# Patient Record
Sex: Male | Born: 1955
Health system: Southern US, Community
[De-identification: ages and names within clinical notes are randomized; demographics above are authoritative.]

## PROBLEM LIST (undated history)

## (undated) DIAGNOSIS — D649 Anemia, unspecified: Secondary | ICD-10-CM

## (undated) DIAGNOSIS — I10 Essential (primary) hypertension: Secondary | ICD-10-CM

## (undated) DIAGNOSIS — E7800 Pure hypercholesterolemia, unspecified: Secondary | ICD-10-CM

## (undated) DIAGNOSIS — I259 Chronic ischemic heart disease, unspecified: Secondary | ICD-10-CM

## (undated) HISTORY — PX: TONSILLECTOMY: SUR1361

## (undated) HISTORY — DX: Chronic ischemic heart disease, unspecified: I25.9

## (undated) HISTORY — DX: Essential (primary) hypertension: I10

## (undated) HISTORY — PX: LAPAROSCOPIC INGUINAL HERNIA REPAIR: SUR788

## (undated) HISTORY — DX: Pure hypercholesterolemia, unspecified: E78.00

---

## 2003-01-31 ENCOUNTER — Ambulatory Visit (HOSPITAL_COMMUNITY): Admission: AD | Admit: 2003-01-31 | Discharge: 2003-02-02 | Payer: Self-pay | Admitting: Cardiovascular Disease

## 2003-01-31 HISTORY — PX: CORONARY ANGIOPLASTY: SHX604

## 2004-01-09 ENCOUNTER — Ambulatory Visit (HOSPITAL_BASED_OUTPATIENT_CLINIC_OR_DEPARTMENT_OTHER): Admission: RE | Admit: 2004-01-09 | Discharge: 2004-01-09 | Payer: Self-pay | Admitting: General Surgery

## 2004-01-09 ENCOUNTER — Ambulatory Visit (HOSPITAL_COMMUNITY): Admission: RE | Admit: 2004-01-09 | Discharge: 2004-01-09 | Payer: Self-pay | Admitting: General Surgery

## 2004-02-20 ENCOUNTER — Emergency Department (HOSPITAL_COMMUNITY): Admission: EM | Admit: 2004-02-20 | Discharge: 2004-02-20 | Payer: Self-pay | Admitting: Emergency Medicine

## 2004-07-16 ENCOUNTER — Emergency Department (HOSPITAL_COMMUNITY): Admission: EM | Admit: 2004-07-16 | Discharge: 2004-07-16 | Payer: Self-pay | Admitting: Emergency Medicine

## 2009-01-20 ENCOUNTER — Encounter: Admission: RE | Admit: 2009-01-20 | Discharge: 2009-01-20 | Payer: Self-pay | Admitting: Cardiology

## 2009-02-12 ENCOUNTER — Encounter: Admission: RE | Admit: 2009-02-12 | Discharge: 2009-02-12 | Payer: Self-pay | Admitting: Internal Medicine

## 2009-05-27 HISTORY — PX: US ECHOCARDIOGRAPHY: HXRAD669

## 2010-02-26 ENCOUNTER — Other Ambulatory Visit (INDEPENDENT_AMBULATORY_CARE_PROVIDER_SITE_OTHER): Payer: BC Managed Care – PPO

## 2010-02-26 DIAGNOSIS — I10 Essential (primary) hypertension: Secondary | ICD-10-CM

## 2010-02-26 DIAGNOSIS — I2589 Other forms of chronic ischemic heart disease: Secondary | ICD-10-CM

## 2010-02-26 DIAGNOSIS — E785 Hyperlipidemia, unspecified: Secondary | ICD-10-CM

## 2010-03-18 ENCOUNTER — Encounter: Payer: Self-pay | Admitting: Cardiology

## 2010-03-18 DIAGNOSIS — E7800 Pure hypercholesterolemia, unspecified: Secondary | ICD-10-CM | POA: Insufficient documentation

## 2010-03-18 DIAGNOSIS — I10 Essential (primary) hypertension: Secondary | ICD-10-CM | POA: Insufficient documentation

## 2010-03-18 DIAGNOSIS — I259 Chronic ischemic heart disease, unspecified: Secondary | ICD-10-CM | POA: Insufficient documentation

## 2010-03-19 ENCOUNTER — Encounter: Payer: Self-pay | Admitting: Cardiology

## 2010-04-19 ENCOUNTER — Encounter: Payer: Self-pay | Admitting: Cardiology

## 2010-04-19 ENCOUNTER — Ambulatory Visit (INDEPENDENT_AMBULATORY_CARE_PROVIDER_SITE_OTHER): Payer: BC Managed Care – PPO | Admitting: Cardiology

## 2010-04-19 DIAGNOSIS — I259 Chronic ischemic heart disease, unspecified: Secondary | ICD-10-CM

## 2010-04-19 DIAGNOSIS — I10 Essential (primary) hypertension: Secondary | ICD-10-CM

## 2010-04-19 NOTE — Progress Notes (Signed)
HPI: This pleasant 55 year old gentleman has known ischemic heart disease.  He had a stenting of his intermediate branch on 01/31/03.  His last nuclear stress test 01/02/08 and no ischemia and showed normal LV function.  He has a history of high cholesterol and elevated blood pressure.  Current Outpatient Prescriptions  Medication Sig Dispense Refill  . amLODipine (NORVASC) 5 MG tablet Take 5 mg by mouth daily.        Marland Kitchen aspirin 325 MG EC tablet Take 325 mg by mouth daily.        . clopidogrel (PLAVIX) 75 MG tablet Take 75 mg by mouth daily.        . hydrochlorothiazide (,MICROZIDE/HYDRODIURIL,) 12.5 MG capsule Take 12.5 mg by mouth daily.        . MULTIPLE VITAMIN PO Take by mouth daily.        . rosuvastatin (CRESTOR) 20 MG tablet Take 20 mg by mouth daily.          No Known Allergies  Patient Active Problem List  Diagnoses  . Ischemic heart disease  . Hypertension  . Hypercholesteremia    History  Smoking status  . Former Smoker  . Types: Cigarettes  Smokeless tobacco  . Not on file    History  Alcohol Use: Not on file    Family History  Problem Relation Age of Onset  . Heart attack Father   . Heart attack Brother     Review of Systems: The patient denies any heat or cold intolerance.  No weight gain or weight loss.  The patient denies headaches or blurry vision.  There is no cough or sputum production.  The patient denies dizziness.  There is no hematuria or hematochezia.  The patient denies any muscle aches or arthritis.  The patient denies any rash.  The patient denies frequent falling or instability.  There is no history of depression or anxiety.  All other systems were reviewed and are negative.   Physical Exam: Filed Vitals:   04/19/10 0859  BP: 118/84  Pulse: 64  His weight is 175, up 5 pounds.Pupils equal and reactive.   Extraocular Movements are full.  There is no scleral icterus.  The mouth and pharynx are normal.  The neck is supple.  The carotids reveal  no bruits.  The jugular venous pressure is normal.  The thyroid is not enlarged.  There is no lymphadenopathy.The chest is clear to percussion and auscultation. There are no rales or rhonchi. Expansion of the chest is symmetrical.The precordium is quiet.  The first heart sound is normal.  The second heart sound is physiologically split.  There is no murmur gallop rub or click.  There is no abnormal lift or heave.The abdomen is soft and nontender. Bowel sounds are normal. The liver and spleen are not enlarged. There Are no abdominal masses. There are no bruits.The pedal pulses are good.  There is no phlebitis or edema.  There is no cyanosis or clubbing.    Assessment / Plan: His blood pressure is improving although the diastolic is still slightly high.  He will work harder on exercise low salt diet and weight loss.  Recheck 6 months

## 2010-04-19 NOTE — Assessment & Plan Note (Signed)
The patient is avoiding salty food.  He denies headaches or dizzy spells or other symptoms of high blood pressure.  He is exercising on a treadmill.

## 2010-04-19 NOTE — Assessment & Plan Note (Signed)
The patient has had no recurrent chest pain.  He's not having any shortness of breath or palpitations.  He is to continue same medication

## 2010-06-04 NOTE — Cardiovascular Report (Signed)
Kyle Roberts, Kyle Roberts NO.:  [id number]   MEDICAL RECORD NO.:  [id number]                   PATIENT TYPE:  OIB   LOCATION:  2864                                 FACILITY:  [facility]   PHYSICIAN:  Vesta Mixer, M.D.              DATE OF BIRTH:  [birth year]   DATE OF PROCEDURE:  01/31/2003  DATE OF DISCHARGE:                              CARDIAC CATHETERIZATION   PROCEDURES PERFORMED:  1. Cardiac catheterization.  2. Coronary angiography.  3. Percutaneous transluminal coronary angioplasty and stenting of the ramus     intermedius branch.   CARDIOLOGIST:  Vesta Mixer, M.D.   INDICATIONS FOR PROCEDURE:  Mr. Schrecengost is a 55 year old gentleman with the  recent onset of unstable angina.  He recently had a stress Cardiolite study,  which revealed inferolateral ischemia.  He is referred for heart  catheterization for further evaluation.   DESCRIPTION OF PROCEDURE:  The right femoral artery was easily cannulated  using the modified Seldinger technique.   HEMODYNAMIC DATA:  Left ventricular pressure is 121/7 with an aortic  pressure of 121/81.   ANGIOGRAPHIC DATA:  Left Main Coronary Artery:  The left main coronary  artery is very large and fairly normal.   Left Anterior Descending Artery:  The left anterior descending artery is a  fairly large size.  There are minor luminal irregularities in the LAD.   Ramus Intermedius:  The ramus intermedius branch is a very large vessel.  It  has a stenosis of approximately 90-95% right at the origin of the vessel.  This extends for approximately 6-8 mm.  The remainder of the ramus  intermedius branch is unremarkable.   Left Circumflex Artery:  The left circumflex artery is a relatively small-to-  moderate size vessel.  There are minor luminal irregularities.   Right Coronary Artery:  The right coronary artery is moderate-size and is  dominant.  There are minor-to-moderate irregularities in the right coronary  artery.  The tightness stenosis is between 20 and 30%.  The posterior  descending artery and the posterolateral segment artery are fairly normal.   VENTRICULOGRAPHIC DATA:  The left ventriculogram was performed in the 30 RAO  position.  It revealed normal left ventricular systolic function.  Ejection  fraction is around 65%.   PERCUTANEOUS TRANSLUMINAL CORONARY ANGIOPLASTY AND STENTING:  The 6 [place]  system was traded out for a 7 [place] system.  The patient received 300 mg of  Plavix followed by heparin and a double bolus Integrilin drip.  A BMW  guidewire was positioned down the ramus intermedius vessel.  This was  followed by a 3.0 cutting balloon (3.0 mm x 10 mm in length).  We were going  to attempt to just perform a cutting PTCA on this to hopefully avoid a stent  procedure.   The cutting balloon was advanced down towards the stenosis and was inflated  three times up to  six atmospheres for 30 seconds each.  This resulted in a  significant improvement, but with still slightly hazy appearance.  There was  still good flow down the vessel, but the result was somewhat suboptimal.  At  this point the cutting balloon was advanced one additional time and was  inflated for 90 seconds up to six atmospheres.  This really did not improve  the result of the stenosis.  At this point a 3.0 x 12 mm TAXUS stent was  positioned across the stenosis.  We took great care to make sure that the  stent struts covered the complete ramus intermedius lesion; and, in fact,  probably hang out into the left main to a slight degree.  The stent was  deployed at 14 atmospheres for a total of 36 seconds.  This resulted in a  very nice angiographic result.  He had no complications and there was brisk  flow down the vessel.  The left circumflex artery and the left anterior  descending artery were not compromised by this angioplasty, and did not  require any procedure.   COMPLICATIONS:  None.   CONCLUSIONS:   Successful percutaneous transluminal coronary angioplasty and  stenting of the ramus intermedius vessel.   If any intervention needs to be performed on the left anterior descending  artery care should be taken to have the wire hug the upper anterior wall to  avoid the stent struts.  It should be quite easy as the left anterior  descending is fairly big at this point.  Accessing the ramus intermedius  artery should not be a problem, and the left circumflex also appears to be  fairly easily accessible.                                               Vesta Mixer, M.D.    PJN/MEDQ  D:  01/31/2003  T:  02/01/2003  Job:  [id number]   cc:   Cassell Clement, M.D.  [address]  [place]  [place] [address]  Fax: [phone]

## 2010-06-04 NOTE — Op Note (Signed)
Kyle Roberts, ROTERT NO.:  [id number]   MEDICAL RECORD NO.:  [id number]          PATIENT TYPE:  AMB   LOCATION:  DSC                          FACILITY:  [facility]   PHYSICIAN:  Leonie Man, M.D.   DATE OF BIRTH:  Aug 18, 1955   DATE OF PROCEDURE:  01/09/2004  DATE OF DISCHARGE:  01/09/2004                                 OPERATIVE REPORT   PREOPERATIVE DIAGNOSIS:  Right inguinal hernia.   POSTOPERATIVE DIAGNOSIS:  Right direct inguinal hernia.   PROCEDURE:  Repair of right inguinal hernia with mesh.   SURGEON:  Mardene Celeste. Lurene Shadow, M.D.   ASSISTANT:  Nurse.   ANESTHESIA:  General supplemented with Marcaine 0.5% with epinephrine  1:200,000.   Note, the patient is a 55 year old man, who presents with a right-sided  inguinal hernia that has been increasing in size and causing some discomfort  over the past several months.  He comes to the operating room now for repair  after the risks and potential benefits of surgery have been discussed, all  questions answered, and consent obtained.   DESCRIPTION OF PROCEDURE:  Following the induction of satisfactory general  anesthesia with the patient positioned supinely, the lower abdomen is  prepped and draped to be included in the sterile operative field.  I  infiltrated the lower abdominal crease on the right side with 0.5% Marcaine  with epinephrine, made a transverse incision, deepened this through the skin  and subcutaneous tissues, and down to the external oblique aponeurosis.  External oblique aponeurosis opened up through the external inguinal ring  with retraction of the ilioinguinal nerve cephalad and medially.  Spermatic  cord is elevated and held with a Penrose drain, and the spermatic cord was  well-skeletonized and on dissecting through the spermatic cord, no indirect  sac was noted.  There was a very large indirect hernia occupying the area of  Hesselbach's triangle.  This was reduced back into the  retroperitoneum and  repaired with an onlay patch of polypropylene mesh which was sewn into the  pubic tubercle with a 2-0 Prolene suture, carrying the running suture up  along the conjoined tendon to the internal ring and again from the pubic  tubercle carrying a running suture of 2-0 Prolene along the shelving edge of  Poupart's ligament to the internal ring.  The mesh was then split so as to  allow protrusion of the cord between the leaflets of the mesh.  The leaflets  of the mesh were then trimmed and sutured into the internal oblique muscles  behind the cord.  The newly formed internal ring would easily allow the cord  to pass through; however, it was snug enough not to allow any future  herniation, I think.  All the areas of the dissection were then checked for  hemostasis and noted to be dry.  A few additional bleeding points were  treated with electrocautery.  Sponge and instrument counts were verified and  the spermatic cord returned to its usual anatomic position and the external  oblique aponeurosis closed over the cord with a running suture of  2-0  Vicryl.  Scarpa's fascia and subcutaneous tissue was closed with a running 3-  0 Vicryl suture, and the  skin was closed with a running 4-0 Monocryl suture and then reinforced with  Steri-Strips, and a sterile dressing was applied.  Anesthetic reversed.  The  patient removed from the operating room to the recovery room in stable  condition, having tolerated the procedure well.      Patr   PB/MEDQ  D:  01/09/2004  T:  01/11/2004  Job:  [id number]

## 2010-06-04 NOTE — H&P (Signed)
NAME:  Kyle Roberts, Kyle Roberts NO.:  [id number]   MEDICAL RECORD NO.:  [id number]                   PATIENT TYPE:  OIB   LOCATION:                                       FACILITY:  [facility]   PHYSICIAN:  Vesta Mixer, M.D.              DATE OF BIRTH:  1955/07/27   DATE OF ADMISSION:  01/31/2003  DATE OF DISCHARGE:                                HISTORY & PHYSICAL   HISTORY OF PRESENT ILLNESS:  Kyle Roberts is a 55 year old gentleman with a  history of chest pain and an abnormal Cardiolite study.  He is scheduled for  a heart catheterization.   Kyle Roberts has had intermittent episodes of chest pain for the past 20  years.  He recalls having a severe episode of chest pain approximately 20  years ago.  The episode lasted for a day or so and then he felt better.  Since that time, he has had occasional twinges of chest pain, but never  anything serious.   Several months ago, he developed some episodes of chest pain that was a  constant low pressure heaviness in his chest.  The chest pressure and  heaviness occurred with any sort of activity, such as lifting or walking  upstairs.  It occasionally occurred with rest.  The episodes would gradually  get better as he rested and would last maybe 5-10 minutes.  He never had any  episodes of syncope or presyncope.  He did have some mild dyspnea.  He never  had any PND or orthopnea.   CURRENT MEDICATIONS:  1. Multivitamins once a day.  2. Aspirin 325 mg a day.   ALLERGIES:  He has no known drug allergies.   PAST MEDICAL HISTORY:  History of tonsillectomy.   SOCIAL HISTORY:  The patient does not smoke.  He owns [employer].  He plays golf, but does not get any regular aerobic exercise.  He drinks  alcohol occasionally.   FAMILY HISTORY:  His father died at age 32 due to a myocardial infarction.  His mother is still living and is in her 63s.   REVIEW OF SYSTEMS:  His review of systems was reviewed and is  essentially  negative.   PHYSICAL EXAMINATION:  GENERAL APPEARANCE:  He is a middle-aged gentleman in  no acute distress.  He is alert and oriented x 3.  His mood and affect are  normal.  VITAL SIGNS:  His heart rate is 60 and his blood pressure is 120/95.  NECK:  2+ carotids.  He has no bruits.  There is no JVD and no thyromegaly.  LUNGS:  Clear to auscultation.  HEART:  Regular rate.  S1 and S2 with no murmurs, rubs, or gallops.  ABDOMEN:  Good bowel sounds.  Nontender.  EXTREMITIES:  He has no cyanosis, clubbing, or edema.  NEUROLOGIC:  Nonfocal.   LABORATORY DATA:  His EKG reveals  normal sinus rhythm.  He has no ST-T wave  changes.  He did have T-wave inversions during the stress test today.  The  stress Cardiolite images reveal ST-T wave changes.  He had inferior  ischemia.   Kyle Roberts presents with new onset chest pain consistent with angina.  He  had an abnormal stress test, as well as an abnormal Cardiolite scan.  I have  recommended that we proceed with heart catheterization.  We have discussed  the risks, benefits, and options of heart catheterization.  He understands  and agrees to proceed.                                                Vesta Mixer, M.D.    PJN/MEDQ  D:  01/30/2003  T:  01/30/2003  Job:  [id number]

## 2010-06-24 ENCOUNTER — Encounter: Payer: Self-pay | Admitting: Cardiovascular Disease

## 2010-06-25 ENCOUNTER — Encounter: Payer: Self-pay | Admitting: Cardiology

## 2010-07-09 ENCOUNTER — Other Ambulatory Visit: Payer: Self-pay | Admitting: *Deleted

## 2010-07-09 DIAGNOSIS — I259 Chronic ischemic heart disease, unspecified: Secondary | ICD-10-CM

## 2010-07-09 MED ORDER — CLOPIDOGREL BISULFATE 75 MG PO TABS: 75.0000 mg | ORAL_TABLET | Freq: Every day | ORAL | Status: DC

## 2010-07-09 NOTE — Telephone Encounter (Signed)
Refilled meds per fax request.  

## 2010-10-10 ENCOUNTER — Other Ambulatory Visit: Payer: Self-pay | Admitting: Cardiology

## 2010-10-10 DIAGNOSIS — I119 Hypertensive heart disease without heart failure: Secondary | ICD-10-CM

## 2011-01-24 ENCOUNTER — Ambulatory Visit (INDEPENDENT_AMBULATORY_CARE_PROVIDER_SITE_OTHER): Payer: BC Managed Care – PPO | Admitting: Cardiology

## 2011-01-24 ENCOUNTER — Encounter: Payer: Self-pay | Admitting: Cardiology

## 2011-01-24 VITALS — BP 120/84 | HR 64 | Ht 72.0 in | Wt 170.0 lb

## 2011-01-24 DIAGNOSIS — I259 Chronic ischemic heart disease, unspecified: Secondary | ICD-10-CM

## 2011-01-24 DIAGNOSIS — I119 Hypertensive heart disease without heart failure: Secondary | ICD-10-CM

## 2011-01-24 DIAGNOSIS — E7800 Pure hypercholesterolemia, unspecified: Secondary | ICD-10-CM

## 2011-01-24 DIAGNOSIS — I10 Essential (primary) hypertension: Secondary | ICD-10-CM

## 2011-01-24 NOTE — Assessment & Plan Note (Signed)
The patient has a history of significant dyslipidemia.  He is on Crestor 20 mg daily.  He is not to careful about taking all of his medicines every day.  Return in the importance of taking each of his medicines daily as instructed.  He did not come in fasting today so he will return in the future for fasting lab work.

## 2011-01-24 NOTE — Progress Notes (Signed)
Kyle Roberts Date of Birth:  [birth year] [facility] [address] [place], [place]  [address] [phone]  Fax   [phone]  HPI: This pleasant 56 year old gentleman is seen for a scheduled 6 month followup office visit.  He has a history of known ischemic heart disease.  He had stenting of an intermediate branch on 01/31/03.  His last nuclear stress test of 01/02/08 showed normal LV function and no ischemia.  The patient has not been expressing any chest pain or shortness of breath.  Current Outpatient Prescriptions  Medication Sig Dispense Refill  . amLODipine (NORVASC) 5 MG tablet TAKE ONE TABLET BY MOUTH EVERY DAY FOR BLOOD PRESSURE  30 tablet  11  . aspirin 325 MG EC tablet Take 325 mg by mouth daily.        . clopidogrel (PLAVIX) 75 MG tablet Take 1 tablet (75 mg total) by mouth daily.  30 tablet  11  . hydrochlorothiazide (,MICROZIDE/HYDRODIURIL,) 12.5 MG capsule Take 12.5 mg by mouth daily.        . MULTIPLE VITAMIN PO Take by mouth daily.        . rosuvastatin (CRESTOR) 20 MG tablet Take 20 mg by mouth daily.          No Known Allergies  Patient Active Problem List  Diagnoses  . Ischemic heart disease  . Hypertension  . Hypercholesteremia    History  Smoking status  . Former Smoker  . Types: Cigarettes  Smokeless tobacco  . Not on file    History  Alcohol Use: Not on file    Family History  Problem Relation Age of Onset  . Heart attack Father   . Heart attack Brother     Review of Systems: The patient denies any heat or cold intolerance.  No weight gain or weight loss.  The patient denies headaches or blurry vision.  There is no cough or sputum production.  The patient denies dizziness.  There is no hematuria or hematochezia.  The patient denies any muscle aches or arthritis.  The patient denies any rash.  The patient denies frequent falling or instability.  There is no history of depression or anxiety.  All other systems were  reviewed and are negative.   Physical Exam: Filed Vitals:   01/24/11 1009  BP: 120/84  Pulse:    Repeat blood pressure 120/84.  The general appearance reveals a well-developed well-nourished gentleman in no distress.  Pupils equal and reactive.   Extraocular Movements are full.  There is no scleral icterus.  The mouth and pharynx are normal.  The neck is supple.  The carotids reveal no bruits.  The jugular venous pressure is normal.  The thyroid is not enlarged.  There is no lymphadenopathy.  The chest is clear to percussion and auscultation. There are no rales or rhonchi. Expansion of the chest is symmetrical.  The precordium is quiet.  The first heart sound is normal.  The second heart sound is physiologically split.  There is no murmur gallop rub or click.  There is no abnormal lift or heave.  The abdomen is soft and nontender. Bowel sounds are normal. The liver and spleen are not enlarged. There Are no abdominal masses. There are no bruits.  The pedal pulses are good.  There is no phlebitis or edema.  There is no cyanosis or clubbing. Strength is normal and symmetrical in all extremities.  There is no lateralizing weakness.  There are no sensory deficits.  The skin  is warm and dry.  There is no rash.    Assessment / Plan: Continue same medication.  Continue regular exercise.  He is to take his blood pressure and cholesterol medicines every day and not miss days.  He will return soon for fasting lab work.  Return in 6 months for office visit and fasting lab work also

## 2011-01-24 NOTE — Assessment & Plan Note (Signed)
His blood pressure today was a little high on arrival.  I rechecked it later in the exam and it had come down 120/84.  He usually checks his blood pressure in the machine at [facility] and he reports that his blood pressure at that time he is normally normal.  He does admit to some whitecoat syndrome

## 2011-01-24 NOTE — Patient Instructions (Signed)
Will schedule fasting labs soon ([email or web address]) Your physician recommends that you continue on your current medications as directed. Please refer to the Current Medication list given to you today. Your physician wants you to follow-up in: 6 months You will receive a reminder letter in the mail two months in advance. If you don't receive a letter, please call our office to schedule the follow-up appointment.

## 2011-01-24 NOTE — Assessment & Plan Note (Signed)
No chest pain or shortness of breath.  He walks every day for exercise.  His weight is down 5 pounds since last visit.

## 2011-02-08 ENCOUNTER — Other Ambulatory Visit (INDEPENDENT_AMBULATORY_CARE_PROVIDER_SITE_OTHER): Payer: BC Managed Care – PPO | Admitting: *Deleted

## 2011-02-08 DIAGNOSIS — E7800 Pure hypercholesterolemia, unspecified: Secondary | ICD-10-CM

## 2011-02-08 DIAGNOSIS — I119 Hypertensive heart disease without heart failure: Secondary | ICD-10-CM

## 2011-02-08 LAB — BASIC METABOLIC PANEL
BUN: 17 mg/dL (ref 6–23)
Calcium: 9.1 mg/dL (ref 8.4–10.5)
Chloride: 107 mEq/L (ref 96–112)
Creatinine, Ser: 0.8 mg/dL (ref 0.4–1.5)
GFR: 102.62 mL/min (ref >60.00)
Potassium: 3.8 mEq/L (ref 3.5–5.1)
Sodium: 141 mEq/L (ref 135–145)

## 2011-02-08 LAB — HEPATIC FUNCTION PANEL
AST: 24 U/L (ref 0–37)
Albumin: 4 g/dL (ref 3.5–5.2)
Total Protein: 7.4 g/dL (ref 6.0–8.3)

## 2011-02-08 LAB — LIPID PANEL: HDL: 39.7 mg/dL (ref >39.00)

## 2011-02-08 LAB — LDL CHOLESTEROL, DIRECT: Direct LDL: 189.8 mg/dL

## 2011-02-16 ENCOUNTER — Telehealth: Payer: Self-pay | Admitting: *Deleted

## 2011-02-16 NOTE — Telephone Encounter (Signed)
Message copied by Burnell Blanks on Wed Feb 16, 2011  2:47 PM ------      Message from: Cassell Clement      Created: Wed Feb 09, 2011  9:29 AM       The LDL is still too high for someone who has had stents.  Increase Crestor to 40 mg daily and be very careful with diet.

## 2011-02-16 NOTE — Telephone Encounter (Signed)
Message copied by Burnell Blanks on Wed Feb 16, 2011  2:46 PM ------      Message from: Cassell Clement      Created: Wed Feb 16, 2011  1:15 PM       Agree with plan.

## 2011-02-16 NOTE — Telephone Encounter (Signed)
Patient not taking Crestor at all, will resume.

## 2011-02-23 ENCOUNTER — Other Ambulatory Visit: Payer: Self-pay | Admitting: *Deleted

## 2011-02-23 MED ORDER — ROSUVASTATIN CALCIUM 20 MG PO TABS: 20.0000 mg | ORAL_TABLET | Freq: Every day | ORAL | Status: DC

## 2011-03-04 ENCOUNTER — Telehealth: Payer: Self-pay | Admitting: Cardiology

## 2011-03-04 NOTE — Telephone Encounter (Signed)
New Problem:     Patient called in needing a refill of his rosuvastatin (CRESTOR) 20 MG tablet.

## 2011-03-08 ENCOUNTER — Other Ambulatory Visit: Payer: Self-pay | Admitting: Cardiology

## 2011-03-08 ENCOUNTER — Telehealth: Payer: Self-pay | Admitting: Cardiology

## 2011-03-08 MED ORDER — ROSUVASTATIN CALCIUM 20 MG PO TABS: 20.0000 mg | ORAL_TABLET | Freq: Every day | ORAL | Status: DC

## 2011-03-08 NOTE — Telephone Encounter (Signed)
New Msg: Pt needs crestor refill called in today. Pt is out of medication

## 2011-03-08 NOTE — Telephone Encounter (Signed)
New Msg: Pharmacy calling needing for our office to call pt insurance and give prior authorization for pt crestor refill. Please call pharmacy to discuss further if necessary and/or call pt insurance for prior auth.

## 2011-03-08 NOTE — Telephone Encounter (Signed)
Left message at pharmacy will discuss with  Dr. Patty Sermons and call back. Patient aware

## 2011-03-14 ENCOUNTER — Telehealth: Payer: Self-pay | Admitting: Cardiology

## 2011-03-14 DIAGNOSIS — E7800 Pure hypercholesterolemia, unspecified: Secondary | ICD-10-CM

## 2011-03-14 MED ORDER — ATORVASTATIN CALCIUM 20 MG PO TABS: 20.0000 mg | ORAL_TABLET | Freq: Every day | ORAL | Status: DC

## 2011-03-14 NOTE — Telephone Encounter (Signed)
Agree 

## 2011-03-14 NOTE — Telephone Encounter (Signed)
Vytorin not covered under plan with prior authorization.  Will try Lipitor 20 mg daily per  Dr. Patty Sermons.  Will send to pharmacy

## 2011-03-14 NOTE — Telephone Encounter (Signed)
Pt calling re going back on one of the statins, vytorin is what he wants to take, is this ok with [facility]?

## 2011-04-19 ENCOUNTER — Other Ambulatory Visit: Payer: BC Managed Care – PPO

## 2011-04-20 ENCOUNTER — Other Ambulatory Visit: Payer: Self-pay | Admitting: *Deleted

## 2011-04-20 MED ORDER — HYDROCHLOROTHIAZIDE 12.5 MG PO CAPS: 12.5000 mg | ORAL_CAPSULE | Freq: Every day | ORAL | Status: DC

## 2011-05-05 ENCOUNTER — Other Ambulatory Visit: Payer: BC Managed Care – PPO

## 2011-06-08 ENCOUNTER — Other Ambulatory Visit: Payer: BC Managed Care – PPO

## 2011-06-09 ENCOUNTER — Ambulatory Visit (INDEPENDENT_AMBULATORY_CARE_PROVIDER_SITE_OTHER): Payer: BC Managed Care – PPO | Admitting: *Deleted

## 2011-06-09 DIAGNOSIS — I10 Essential (primary) hypertension: Secondary | ICD-10-CM

## 2011-06-09 DIAGNOSIS — E7800 Pure hypercholesterolemia, unspecified: Secondary | ICD-10-CM

## 2011-06-09 LAB — BASIC METABOLIC PANEL
CO2: 28 mEq/L (ref 19–32)
Calcium: 9.4 mg/dL (ref 8.4–10.5)
Chloride: 103 mEq/L (ref 96–112)
GFR: 99.69 mL/min (ref >60.00)
Glucose, Bld: 103 mg/dL — ABNORMAL HIGH (ref 70–99)
Sodium: 138 mEq/L (ref 135–145)

## 2011-06-09 LAB — HEPATIC FUNCTION PANEL
ALT: 25 U/L (ref 0–53)
AST: 27 U/L (ref 0–37)
Albumin: 3.9 g/dL (ref 3.5–5.2)
Total Protein: 7.2 g/dL (ref 6.0–8.3)

## 2011-06-09 LAB — LIPID PANEL
Triglycerides: 74 mg/dL (ref 0.0–149.0)
VLDL: 14.8 mg/dL (ref 0.0–40.0)

## 2011-06-10 ENCOUNTER — Telehealth: Payer: Self-pay | Admitting: Cardiology

## 2011-06-10 NOTE — Telephone Encounter (Signed)
Spoke with patient regarding PSA.  Unable to add since it has been over 24 hours.  Will just call his urologist

## 2011-06-10 NOTE — Telephone Encounter (Signed)
New problem:   Patient calling question regarding blood work .

## 2011-06-13 NOTE — Progress Notes (Signed)
Quick Note:  Please report to patient. The recent labs are stable. Continue same medication and careful diet. Cholesterol much improved on current Rx.LFTs ok ______

## 2011-06-14 ENCOUNTER — Telehealth: Payer: Self-pay | Admitting: *Deleted

## 2011-06-14 NOTE — Telephone Encounter (Signed)
Advised patient

## 2011-06-14 NOTE — Telephone Encounter (Signed)
Message copied by Burnell Blanks on Tue Jun 14, 2011 10:29 AM ------      Message from: Cassell Clement      Created: Mon Jun 13, 2011  6:36 PM       Please report to patient.  The recent labs are stable. Continue same medication and careful diet. Cholesterol much improved on current Rx.LFTs ok

## 2011-06-14 NOTE — Telephone Encounter (Signed)
No need to check again that soon.

## 2011-06-14 NOTE — Telephone Encounter (Signed)
Advised of labs 

## 2011-07-25 ENCOUNTER — Other Ambulatory Visit: Payer: Self-pay | Admitting: *Deleted

## 2011-07-25 DIAGNOSIS — I259 Chronic ischemic heart disease, unspecified: Secondary | ICD-10-CM

## 2011-07-25 MED ORDER — CLOPIDOGREL BISULFATE 75 MG PO TABS: 75.0000 mg | ORAL_TABLET | Freq: Every day | ORAL | Status: DC

## 2011-07-25 NOTE — Telephone Encounter (Signed)
Refilled clopidogrel.

## 2011-08-31 ENCOUNTER — Ambulatory Visit (INDEPENDENT_AMBULATORY_CARE_PROVIDER_SITE_OTHER): Payer: BC Managed Care – PPO | Admitting: Cardiology

## 2011-08-31 ENCOUNTER — Encounter: Payer: Self-pay | Admitting: Cardiology

## 2011-08-31 VITALS — BP 126/86 | HR 68 | Ht 72.0 in | Wt 171.0 lb

## 2011-08-31 DIAGNOSIS — I2510 Atherosclerotic heart disease of native coronary artery without angina pectoris: Secondary | ICD-10-CM

## 2011-08-31 DIAGNOSIS — I10 Essential (primary) hypertension: Secondary | ICD-10-CM

## 2011-08-31 DIAGNOSIS — I259 Chronic ischemic heart disease, unspecified: Secondary | ICD-10-CM

## 2011-08-31 DIAGNOSIS — E7800 Pure hypercholesterolemia, unspecified: Secondary | ICD-10-CM

## 2011-08-31 DIAGNOSIS — I119 Hypertensive heart disease without heart failure: Secondary | ICD-10-CM

## 2011-08-31 LAB — BASIC METABOLIC PANEL
BUN: 19 mg/dL (ref 6–23)
Creatinine, Ser: 0.8 mg/dL (ref 0.4–1.5)
Potassium: 3.9 mEq/L (ref 3.5–5.1)
Sodium: 140 mEq/L (ref 135–145)

## 2011-08-31 LAB — LIPID PANEL
Cholesterol: 174 mg/dL (ref 0–200)
LDL Cholesterol: 114 mg/dL — ABNORMAL HIGH (ref 0–99)
Total CHOL/HDL Ratio: 4
VLDL: 18.4 mg/dL (ref 0.0–40.0)

## 2011-08-31 LAB — HEPATIC FUNCTION PANEL
ALT: 25 U/L (ref 0–53)
AST: 25 U/L (ref 0–37)
Total Bilirubin: 0.8 mg/dL (ref 0.3–1.2)

## 2011-08-31 NOTE — Progress Notes (Signed)
Quick Note:  Please report to patient. The recent labs are stable. Continue same medication and careful diet. The LDL cholesterol has gone back up to 114 so the patient needs to work harder on diet and to be sure that he does not miss taking any of his doses of Crestor. The liver functions are normal. The blood sugar is slightly elevated again and he needs to watch his carbohydrates carefully. ______

## 2011-08-31 NOTE — Assessment & Plan Note (Signed)
The patient has not taking his blood pressure medication for the last several days and his diastolic pressure today is slightly elevated.  Dr. Lenor Coffin the importance of taking his blood pressure pills every day without fail.  The patient has not been having any dizzy spells or palpitations.  He checks his blood pressure occasionally at home and normally it is within acceptable limits

## 2011-08-31 NOTE — Assessment & Plan Note (Signed)
The patient walks at least 4 days a week for exercise.  He has not been expressing any chest pain or shortness of breath or palpitations.

## 2011-08-31 NOTE — Progress Notes (Signed)
Sallyanne Kuster Date of Birth:  Aug 21, 1955 [facility] [address] [place], [place]  [address] [phone]  Fax   [phone]  HPI: This pleasant 56 year old gentleman is seen for a six-month followup office visit.  He has a past history of known ischemic heart disease.  He underwent stenting of an intermediate branch on 01/31/03.  He had a nuclear stress test 01/02/08 showing no ischemia and normal LV function.  Current Outpatient Prescriptions  Medication Sig Dispense Refill  . amLODipine (NORVASC) 5 MG tablet TAKE ONE TABLET BY MOUTH EVERY DAY FOR BLOOD PRESSURE  30 tablet  11  . aspirin 325 MG EC tablet Take 325 mg by mouth daily.        . clopidogrel (PLAVIX) 75 MG tablet Take 1 tablet (75 mg total) by mouth daily.  30 tablet  11  . hydrochlorothiazide (MICROZIDE) 12.5 MG capsule Take 1 capsule (12.5 mg total) by mouth daily.  90 capsule  2  . MULTIPLE VITAMIN PO Take by mouth daily.        Marland Kitchen atorvastatin (LIPITOR) 20 MG tablet Take 20 mg by mouth Daily.        No Known Allergies  Patient Active Problem List  Diagnosis  . Ischemic heart disease  . Hypertension  . Hypercholesteremia    History  Smoking status  . Former Smoker  . Types: Cigarettes  Smokeless tobacco  . Not on file    History  Alcohol Use: Not on file    Family History  Problem Relation Age of Onset  . Heart attack Father   . Heart attack Brother     Review of Systems: The patient denies any heat or cold intolerance.  No weight gain or weight loss.  The patient denies headaches or blurry vision.  There is no cough or sputum production.  The patient denies dizziness.  There is no hematuria or hematochezia.  The patient denies any muscle aches or arthritis.  The patient denies any rash.  The patient denies frequent falling or instability.  There is no history of depression or anxiety.  All other systems were reviewed and are negative.   Physical Exam: Filed Vitals:   08/31/11 0852  BP: 126/86  Pulse: 68   the general appearance reveals a well-developed well-nourished gentleman in no distress.The head and neck exam reveals pupils equal and reactive.  Extraocular movements are full.  There is no scleral icterus.  The mouth and pharynx are normal.  The neck is supple.  The carotids reveal no bruits.  The jugular venous pressure is normal.  The  thyroid is not enlarged.  There is no lymphadenopathy.  The chest is clear to percussion and auscultation.  There are no rales or rhonchi.  Expansion of the chest is symmetrical.  The precordium is quiet.  The first heart sound is normal.  The second heart sound is physiologically split.  There is no murmur gallop rub or click.  There is no abnormal lift or heave.  The abdomen is soft and nontender.  The bowel sounds are normal.  The liver and spleen are not enlarged.  There are no abdominal masses.  There are no abdominal bruits.  Extremities reveal good pedal pulses.  There is no phlebitis or edema.  There is no cyanosis or clubbing.  Strength is normal and symmetrical in all extremities.  There is no lateralizing weakness.  There are no sensory deficits.  The skin is warm and dry.  There is no  rash.      Assessment / Plan: The patient is to continue same medication.  He is not having any current cardiac symptoms.  Work is pending.  We emphasized the importance of staying on his medicines daily for adequate blood pressure control.  He'll return in 6 months for followup office visit EKG and fasting lab work

## 2011-08-31 NOTE — Patient Instructions (Addendum)
Will obtain labs today and call you with the results   Your physician recommends that you continue on your current medications as directed. Please refer to the Current Medication list given to you today.  Your physician wants you to follow-up in: 6 months with fasting labs (lp/bmet/hfp)  You will receive a reminder letter in the mail two months in advance. If you don't receive a letter, please call our office to schedule the follow-up appointment.  

## 2011-08-31 NOTE — Assessment & Plan Note (Signed)
The patient has a history of hypercholesterolemia.  He is on Crestor 20 mg daily.  Blood work today is pending.  He denies any myalgias or side effects

## 2011-09-02 ENCOUNTER — Telehealth: Payer: Self-pay | Admitting: *Deleted

## 2011-09-02 NOTE — Telephone Encounter (Signed)
Advised patient of lab results   Taking Lipitor not Crestor

## 2011-09-02 NOTE — Telephone Encounter (Signed)
Message copied by Burnell Blanks on Fri Sep 02, 2011  8:26 AM ------      Message from: Cassell Clement      Created: Wed Aug 31, 2011  4:12 PM       Please report to patient.  The recent labs are stable. Continue same medication and careful diet.  The LDL cholesterol has gone back up to 114 so the patient needs to work harder on diet and to be sure that he does not miss taking any of his doses of Crestor.  The liver functions are normal.  The blood sugar is slightly elevated again and he needs to watch his carbohydrates carefully.

## 2011-09-02 NOTE — Telephone Encounter (Signed)
Message copied by Burnell Blanks on Fri Sep 02, 2011  8:27 AM ------      Message from: Cassell Clement      Created: Wed Aug 31, 2011  4:12 PM       Please report to patient.  The recent labs are stable. Continue same medication and careful diet.  The LDL cholesterol has gone back up to 114 so the patient needs to work harder on diet and to be sure that he does not miss taking any of his doses of Crestor.  The liver functions are normal.  The blood sugar is slightly elevated again and he needs to watch his carbohydrates carefully.

## 2011-09-05 ENCOUNTER — Telehealth: Payer: Self-pay | Admitting: Cardiology

## 2011-09-08 ENCOUNTER — Telehealth: Payer: Self-pay | Admitting: Cardiology

## 2011-09-08 NOTE — Telephone Encounter (Signed)
New msg Pt wants to know if  Dr Patty Sermons could recommend a podiatrist. Please call

## 2011-09-08 NOTE — Telephone Encounter (Signed)
Left message  Dr. Patty Sermons usually recommends Dr Harriet Pho

## 2011-11-07 ENCOUNTER — Other Ambulatory Visit: Payer: Self-pay | Admitting: *Deleted

## 2011-11-07 DIAGNOSIS — I119 Hypertensive heart disease without heart failure: Secondary | ICD-10-CM

## 2011-11-07 MED ORDER — AMLODIPINE BESYLATE 5 MG PO TABS: 5.0000 mg | ORAL_TABLET | Freq: Every day | ORAL | Status: DC

## 2012-03-26 ENCOUNTER — Other Ambulatory Visit: Payer: Self-pay | Admitting: *Deleted

## 2012-03-26 MED ORDER — ATORVASTATIN CALCIUM 20 MG PO TABS: 20.0000 mg | ORAL_TABLET | Freq: Every day | ORAL | Status: DC

## 2012-04-11 ENCOUNTER — Telehealth: Payer: Self-pay | Admitting: Cardiology

## 2012-04-11 NOTE — Telephone Encounter (Signed)
New Prob   Would like to review his bloodwork with nurse. Please call back.

## 2012-04-11 NOTE — Telephone Encounter (Signed)
Reviewed last labs with paitent

## 2012-07-12 ENCOUNTER — Other Ambulatory Visit: Payer: Self-pay | Admitting: Cardiology

## 2012-10-21 ENCOUNTER — Other Ambulatory Visit: Payer: Self-pay | Admitting: Cardiology

## 2012-11-15 ENCOUNTER — Ambulatory Visit (INDEPENDENT_AMBULATORY_CARE_PROVIDER_SITE_OTHER): Payer: BC Managed Care – PPO | Admitting: Cardiology

## 2012-11-15 ENCOUNTER — Encounter: Payer: Self-pay | Admitting: Cardiology

## 2012-11-15 VITALS — BP 128/78 | HR 61 | Ht 73.0 in | Wt 166.0 lb

## 2012-11-15 DIAGNOSIS — I259 Chronic ischemic heart disease, unspecified: Secondary | ICD-10-CM

## 2012-11-15 DIAGNOSIS — I10 Essential (primary) hypertension: Secondary | ICD-10-CM

## 2012-11-15 DIAGNOSIS — I119 Hypertensive heart disease without heart failure: Secondary | ICD-10-CM

## 2012-11-15 DIAGNOSIS — E7800 Pure hypercholesterolemia, unspecified: Secondary | ICD-10-CM

## 2012-11-15 DIAGNOSIS — I2510 Atherosclerotic heart disease of native coronary artery without angina pectoris: Secondary | ICD-10-CM

## 2012-11-15 LAB — HEPATIC FUNCTION PANEL
AST: 25 U/L (ref 0–37)
Albumin: 4.2 g/dL (ref 3.5–5.2)
Alkaline Phosphatase: 41 U/L (ref 39–117)
Bilirubin, Direct: 0 mg/dL (ref 0.0–0.3)
Total Bilirubin: 1.2 mg/dL (ref 0.3–1.2)

## 2012-11-15 LAB — BASIC METABOLIC PANEL
CO2: 29 mEq/L (ref 19–32)
Calcium: 9.4 mg/dL (ref 8.4–10.5)
Chloride: 104 mEq/L (ref 96–112)
Creatinine, Ser: 0.8 mg/dL (ref 0.4–1.5)
Glucose, Bld: 106 mg/dL — ABNORMAL HIGH (ref 70–99)

## 2012-11-15 LAB — LIPID PANEL
LDL Cholesterol: 104 mg/dL — ABNORMAL HIGH (ref 0–99)
Total CHOL/HDL Ratio: 4

## 2012-11-15 NOTE — Progress Notes (Signed)
Quick Note:  Please report to patient. The recent labs are stable. Continue same medication and careful diet. Cholesterol better. LFTs normal. BS 106 better but still slightly high. Watch carbs. ______

## 2012-11-15 NOTE — Progress Notes (Signed)
Kyle Roberts Date of Birth:  16-Oct-1955  [address] [place], [place]  [address] [phone]  Fax   [phone]  HPI: This pleasant 57 year old gentleman is seen for a one-year followup office visit.  He has a past history of known ischemic heart disease.  He underwent stenting of an intermediate branch on 01/31/03.  He had a nuclear stress test 01/02/08 showing no ischemia and normal LV function.  Since last visit he has been doing well.  He had a complete physical with his PCP Dr. Derrell Lolling about 6 months ago and had a EKG at that time. Current Outpatient Prescriptions  Medication Sig Dispense Refill  . amLODipine (NORVASC) 5 MG tablet Take 1 tablet (5 mg total) by mouth daily.  30 tablet  11  . aspirin 325 MG EC tablet Take 325 mg by mouth daily.        Marland Kitchen atorvastatin (LIPITOR) 20 MG tablet TAKE ONE TABLET BY MOUTH ONE TIME DAILY   30 tablet  0  . MULTIPLE VITAMIN PO Take by mouth daily.        . clopidogrel (PLAVIX) 75 MG tablet Take 1 tablet (75 mg total) by mouth daily.  30 tablet  11  . hydrochlorothiazide (MICROZIDE) 12.5 MG capsule Take 1 capsule (12.5 mg total) by mouth daily.  90 capsule  2   No current facility-administered medications for this visit.    No Known Allergies  Patient Active Problem List   Diagnosis Date Noted  . Ischemic heart disease     Priority: Medium  . Hypertension     Priority: Medium  . Hypercholesteremia     History  Smoking status  . Former Smoker  . Types: Cigarettes  Smokeless tobacco  . Not on file    History  Alcohol Use: Not on file    Family History  Problem Relation Age of Onset  . Heart attack Father   . Heart attack Brother     Review of Systems: The patient denies any heat or cold intolerance.  No weight gain or weight loss.  The patient denies headaches or blurry vision.  There is no cough or sputum production.  The patient denies dizziness.  There is no hematuria or hematochezia.  The patient  denies any muscle aches or arthritis.  The patient denies any rash.  The patient denies frequent falling or instability.  There is no history of depression or anxiety.  All other systems were reviewed and are negative.   Physical Exam: Filed Vitals:   11/15/12 0857  BP: 128/78  Pulse: 61   the general appearance reveals a well-developed well-nourished gentleman in no distress.The head and neck exam reveals pupils equal and reactive.  Extraocular movements are full.  There is no scleral icterus.  The mouth and pharynx are normal.  The neck is supple.  The carotids reveal no bruits.  The jugular venous pressure is normal.  The  thyroid is not enlarged.  There is no lymphadenopathy.  The chest is clear to percussion and auscultation.  There are no rales or rhonchi.  Expansion of the chest is symmetrical.  The precordium is quiet.  The first heart sound is normal.  The second heart sound is physiologically split.  There is no murmur gallop rub or click.  There is no abnormal lift or heave.  The abdomen is soft and nontender.  The bowel sounds are normal.  The liver and spleen are not enlarged.  There are  no abdominal masses.  There are no abdominal bruits.  Extremities reveal good pedal pulses.  There is no phlebitis or edema.  There is no cyanosis or clubbing.  Strength is normal and symmetrical in all extremities.  There is no lateralizing weakness.  There are no sensory deficits.  The skin is warm and dry.  There is no rash.      Assessment / Plan: The patient is to continue same medication.  Blood work today is pending.  Recheck at yearly intervals for followup office visit and get fasting lipid panel hepatic function panel and basal metabolic panel at his next visit.  Recheck an EKG at next visit if he has not had one prior to that visit.

## 2012-11-15 NOTE — Patient Instructions (Signed)
Will obtain labs today and call you with the results (lp/bmet/hfp)  Your physician recommends that you continue on your current medications as directed. Please refer to the Current Medication list given to you today.  Your physician wants you to follow-up in: 1 year ov/lp/bmet/hfp You will receive a reminder letter in the mail two months in advance. If you don't receive a letter, please call our office to schedule the follow-up appointment.  

## 2012-11-15 NOTE — Assessment & Plan Note (Signed)
The patient is walking every day for exercise.  He is not experiencing any chest pain or shortness of breath.  His weight is down 5 pounds from exercising regularly.

## 2012-11-15 NOTE — Assessment & Plan Note (Signed)
Blood pressure was remaining stable.  The patient is no longer taking HCTZ

## 2012-11-15 NOTE — Assessment & Plan Note (Signed)
The patient has a history of hypercholesterolemia.  He remains on statin therapy.  Is not having any side effects.  We're checking fasting lab work today.

## 2012-11-21 ENCOUNTER — Other Ambulatory Visit: Payer: Self-pay | Admitting: Cardiology

## 2013-01-13 ENCOUNTER — Other Ambulatory Visit: Payer: Self-pay | Admitting: Cardiology

## 2013-08-01 NOTE — Telephone Encounter (Signed)
Close Encounter 

## 2013-08-15 ENCOUNTER — Encounter: Payer: Self-pay | Admitting: Cardiology

## 2013-11-15 ENCOUNTER — Encounter: Payer: Self-pay | Admitting: Cardiology

## 2013-11-15 ENCOUNTER — Other Ambulatory Visit (INDEPENDENT_AMBULATORY_CARE_PROVIDER_SITE_OTHER): Payer: BC Managed Care – PPO | Admitting: *Deleted

## 2013-11-15 ENCOUNTER — Ambulatory Visit (INDEPENDENT_AMBULATORY_CARE_PROVIDER_SITE_OTHER): Payer: BC Managed Care – PPO | Admitting: Cardiology

## 2013-11-15 VITALS — BP 116/68 | HR 68 | Ht 73.0 in | Wt 164.0 lb

## 2013-11-15 DIAGNOSIS — N401 Enlarged prostate with lower urinary tract symptoms: Secondary | ICD-10-CM

## 2013-11-15 DIAGNOSIS — E7800 Pure hypercholesterolemia, unspecified: Secondary | ICD-10-CM

## 2013-11-15 DIAGNOSIS — I10 Essential (primary) hypertension: Secondary | ICD-10-CM

## 2013-11-15 DIAGNOSIS — R351 Nocturia: Secondary | ICD-10-CM

## 2013-11-15 DIAGNOSIS — I119 Hypertensive heart disease without heart failure: Secondary | ICD-10-CM

## 2013-11-15 DIAGNOSIS — I259 Chronic ischemic heart disease, unspecified: Secondary | ICD-10-CM

## 2013-11-15 LAB — LIPID PANEL
CHOL/HDL RATIO: 4
Cholesterol: 180 mg/dL (ref 0–200)
HDL: 41.5 mg/dL (ref >39.00)
LDL CALC: 128 mg/dL — AB (ref 0–99)
NonHDL: 138.5
Triglycerides: 54 mg/dL (ref 0.0–149.0)
VLDL: 10.8 mg/dL (ref 0.0–40.0)

## 2013-11-15 LAB — HEPATIC FUNCTION PANEL
ALK PHOS: 45 U/L (ref 39–117)
ALT: 23 U/L (ref 0–53)
AST: 26 U/L (ref 0–37)
Albumin: 3.7 g/dL (ref 3.5–5.2)
BILIRUBIN DIRECT: 0.2 mg/dL (ref 0.0–0.3)
Total Bilirubin: 1.2 mg/dL (ref 0.2–1.2)
Total Protein: 7.3 g/dL (ref 6.0–8.3)

## 2013-11-15 LAB — BASIC METABOLIC PANEL
BUN: 15 mg/dL (ref 6–23)
CHLORIDE: 105 meq/L (ref 96–112)
CO2: 26 mEq/L (ref 19–32)
Calcium: 9.4 mg/dL (ref 8.4–10.5)
Creatinine, Ser: 0.9 mg/dL (ref 0.4–1.5)
GFR: 97.52 mL/min (ref >60.00)
Glucose, Bld: 109 mg/dL — ABNORMAL HIGH (ref 70–99)
POTASSIUM: 4.7 meq/L (ref 3.5–5.1)
Sodium: 141 mEq/L (ref 135–145)

## 2013-11-15 LAB — PSA: PSA: 1.41 ng/mL (ref 0.10–4.00)

## 2013-11-15 NOTE — Patient Instructions (Signed)
Your physician recommends that you continue on your current medications as directed. Please refer to the Current Medication list given to you today.  Your physician wants you to follow-up in: 1 year ov/lp/bmet/hfpYou will receive a reminder letter in the mail two months in advance. If you don't receive a letter, please call our office to schedule the follow-up appointment.

## 2013-11-15 NOTE — Assessment & Plan Note (Signed)
The patient is tolerating low-dose atorvastatin.  No myalgias.  Laboratory work is pending.

## 2013-11-15 NOTE — Assessment & Plan Note (Signed)
Blood pressure is remaining stable on current therapy.  No dizziness or syncope.  No palpitations.

## 2013-11-15 NOTE — Progress Notes (Signed)
Willette Cluster Date of Birth:  1955-04-28 [facility] [address] [place], [place]  [address] [phone]  Fax   [phone]  HPI: This pleasant 58 year old gentleman is seen for a one-year followup office visit. He has a past history of known ischemic heart disease. He underwent stenting of an intermediate branch on 01/31/03. He had a nuclear stress test 01/02/08 showing no ischemia and normal LV function. Since last visit he has been doing well.  He continues to walk almost daily for exercise.   Current Outpatient Prescriptions  Medication Sig Dispense Refill  . amLODipine (NORVASC) 5 MG tablet Take one tablet by mouth one time daily  30 tablet  10  . aspirin 81 MG chewable tablet Chew by mouth daily.      Marland Kitchen atorvastatin (LIPITOR) 10 MG tablet Take 10 mg by mouth daily.      . MULTIPLE VITAMIN PO Take by mouth daily.         No current facility-administered medications for this visit.    No Known Allergies  Patient Active Problem List   Diagnosis Date Noted  . Ischemic heart disease     Priority: Medium  . Hypertension     Priority: Medium  . Hypercholesteremia     History  Smoking status  . Former Smoker  . Types: Cigarettes  Smokeless tobacco  . Not on file    History  Alcohol Use: Not on file    Family History  Problem Relation Age of Onset  . Heart attack Father   . Heart attack Brother     Review of Systems: The patient denies any heat or cold intolerance.  No weight gain or weight loss.  The patient denies headaches or blurry vision.  There is no cough or sputum production.  The patient denies dizziness.  There is no hematuria or hematochezia.  The patient denies any muscle aches or arthritis.  The patient denies any rash.  The patient denies frequent falling or instability.  There is no history of depression or anxiety.  All other systems were reviewed and are negative.   Physical Exam: Filed Vitals:   11/15/13 0820  BP:  116/68  Pulse: 68  The patient appears to be in no distress.  Head and neck exam reveals that the pupils are equal and reactive.  The extraocular movements are full.  There is no scleral icterus.  Mouth and pharynx are benign.  No lymphadenopathy.  No carotid bruits.  The jugular venous pressure is normal.  Thyroid is not enlarged or tender.  Chest is clear to percussion and auscultation.  No rales or rhonchi.  Expansion of the chest is symmetrical.  Heart reveals no abnormal lift or heave.  First and second heart sounds are normal.  There is no murmur gallop rub or click.  The abdomen is soft and nontender.  Bowel sounds are normoactive.  There is no hepatosplenomegaly or mass.  There are no abdominal bruits.  Extremities reveal no phlebitis or edema.  Pedal pulses are good.  There is no cyanosis or clubbing.  Neurologic exam is normal strength and no lateralizing weakness.  No sensory deficits.  Integument reveals no rash  EKG shows normal sinus rhythm and is within normal limits.   Assessment / Plan: 1. ischemic heart disease status post stenting of intermediate branch on 01/31/03.  No recurrent angina 2. Hypercholesterolemia 3. essential hypertension 4. BPH with nocturia  Disposition: Continue on present medications.  Recheck  in one year for office visit EKG lipid panel hepatic function panel basal metabolic panel and PSA

## 2013-11-15 NOTE — Assessment & Plan Note (Signed)
The patient has not had any recurrent chest pain or angina. 

## 2013-11-16 NOTE — Progress Notes (Signed)
Quick Note:  Please report to patient. The recent labs are stable. Continue same medication and careful diet. PSA 1.41 normal ______

## 2013-12-16 ENCOUNTER — Other Ambulatory Visit: Payer: Self-pay | Admitting: Cardiology

## 2014-02-14 ENCOUNTER — Other Ambulatory Visit: Payer: Self-pay | Admitting: Cardiology

## 2014-11-19 ENCOUNTER — Ambulatory Visit: Payer: Self-pay | Admitting: Cardiology

## 2014-11-28 ENCOUNTER — Other Ambulatory Visit (INDEPENDENT_AMBULATORY_CARE_PROVIDER_SITE_OTHER): Payer: Self-pay | Admitting: *Deleted

## 2014-11-28 ENCOUNTER — Ambulatory Visit (INDEPENDENT_AMBULATORY_CARE_PROVIDER_SITE_OTHER): Payer: Self-pay | Admitting: Cardiology

## 2014-11-28 ENCOUNTER — Telehealth: Payer: Self-pay

## 2014-11-28 ENCOUNTER — Encounter: Payer: Self-pay | Admitting: Cardiology

## 2014-11-28 VITALS — BP 128/90 | HR 59 | Ht 73.0 in | Wt 169.0 lb

## 2014-11-28 DIAGNOSIS — I119 Hypertensive heart disease without heart failure: Secondary | ICD-10-CM | POA: Diagnosis not present

## 2014-11-28 DIAGNOSIS — N401 Enlarged prostate with lower urinary tract symptoms: Secondary | ICD-10-CM

## 2014-11-28 DIAGNOSIS — I259 Chronic ischemic heart disease, unspecified: Secondary | ICD-10-CM | POA: Diagnosis not present

## 2014-11-28 DIAGNOSIS — E7800 Pure hypercholesterolemia, unspecified: Secondary | ICD-10-CM

## 2014-11-28 DIAGNOSIS — R351 Nocturia: Secondary | ICD-10-CM

## 2014-11-28 LAB — HEPATIC FUNCTION PANEL
ALT: 25 U/L (ref 9–46)
AST: 26 U/L (ref 10–35)
Albumin: 4.1 g/dL (ref 3.6–5.1)
Alkaline Phosphatase: 45 U/L (ref 40–115)
BILIRUBIN DIRECT: 0.2 mg/dL (ref <=0.2)
BILIRUBIN INDIRECT: 0.6 mg/dL (ref 0.2–1.2)
Total Bilirubin: 0.8 mg/dL (ref 0.2–1.2)
Total Protein: 7.1 g/dL (ref 6.1–8.1)

## 2014-11-28 LAB — LIPID PANEL
CHOL/HDL RATIO: 4.2 ratio (ref <=5.0)
CHOLESTEROL: 178 mg/dL (ref 125–200)
HDL: 42 mg/dL (ref >=40)
LDL Cholesterol: 120 mg/dL (ref <130)
Triglycerides: 80 mg/dL (ref <150)
VLDL: 16 mg/dL (ref <30)

## 2014-11-28 LAB — BASIC METABOLIC PANEL
BUN: 16 mg/dL (ref 7–25)
CALCIUM: 9.2 mg/dL (ref 8.6–10.3)
CO2: 26 mmol/L (ref 20–31)
Chloride: 104 mmol/L (ref 98–110)
Creat: 0.82 mg/dL (ref 0.70–1.25)
GLUCOSE: 116 mg/dL — AB (ref 65–99)
Potassium: 4.1 mmol/L (ref 3.5–5.3)
SODIUM: 140 mmol/L (ref 135–146)

## 2014-11-28 NOTE — Telephone Encounter (Signed)
Referral needed to cardiologist- Dr. Nada Boozer [facility] due to insurance coverage. Will have OV by walk-in weekend of 11/28/14.

## 2014-11-28 NOTE — Patient Instructions (Addendum)
Medication Instructions:  Your physician recommends that you continue on your current medications as directed. Please refer to the Current Medication list given to you today.  Labwork: Lp/bmet/hfp/psa  Testing/Procedures: none  Follow-Up: Your physician wants you to follow-up in: 1 year ov/fasting labs with Dr Johann Capers will receive a reminder letter in the mail two months in advance. If you don't receive a letter, please call our office to schedule the follow-up appointment.  Any Other Special Instructions Will Be Listed Below (If Applicable). You need to find a Primary Care Physician if you do not already have one.  If you need a refill on your cardiac medications before your next appointment, please call your pharmacy.

## 2014-11-28 NOTE — Addendum Note (Signed)
Addended by: Eulis Foster on: 11/28/2014 09:05 AM   Modules accepted: Orders

## 2014-11-28 NOTE — Progress Notes (Signed)
Cardiology Office Note   Date:  11/28/2014   ID:  Kyle Roberts, DOB [birth year], MRN [id number]  PCP:  No primary care provider on file.  Cardiologist: Darlin Coco MD  No chief complaint on file.     History of Present Illness: Kyle Roberts is a 59 y.o. male who presents for 1 year follow-up visit.  This pleasant 59 year old gentleman is seen for a one-year followup office visit. He has a past history of known ischemic heart disease. He underwent stenting of an intermediate branch on 01/31/03. He had a nuclear stress test 01/02/08 showing no ischemia and normal LV function. Since last visit he has been doing well. He continues to walk almost daily for exercise.  He denies any chest pain or shortness of breath or palpitations.  Past Medical History  Diagnosis Date  . Ischemic heart disease   . Hypertension   . Hypercholesteremia     Past Surgical History  Procedure Laterality Date  . Coronary angioplasty  01/31/2003  . US echocardiography  05/27/09     Current Outpatient Prescriptions  Medication Sig Dispense Refill  . amLODipine (NORVASC) 5 MG tablet TAKE ONE TABLET BY MOUTH ONE TIME DAILY  30 tablet 10  . aspirin 81 MG chewable tablet Chew 81 mg by mouth daily.     Marland Kitchen atorvastatin (LIPITOR) 10 MG tablet Take 10 mg by mouth daily.    . MULTIPLE VITAMIN PO Take 1 tablet by mouth daily.      No current facility-administered medications for this visit.    Allergies:   Review of patient's allergies indicates no known allergies.    Social History:  The patient  reports that he has quit smoking. His smoking use included Cigarettes. He does not have any smokeless tobacco history on file.   Family History:  The patient's family history includes Heart attack in his brother and father.    ROS:  Please see the history of present illness.   Otherwise, review of systems are positive for none.   All other systems are reviewed and negative.    PHYSICAL EXAM: VS:   BP 128/90 mmHg  Pulse 59  Ht 6\' 1"  (1.854 m)  Wt 169 lb (76.658 kg)  BMI 22.30 kg/m2 , BMI Body mass index is 22.3 kg/(m^2). GEN: Well nourished, well developed, in no acute distress HEENT: normal Neck: no JVD, carotid bruits, or masses Cardiac: RRR; no murmurs, rubs, or gallops,no edema  Respiratory:  clear to auscultation bilaterally, normal work of breathing GI: soft, nontender, nondistended, + BS MS: no deformity or atrophy Skin: warm and dry, no rash Neuro:  Strength and sensation are intact Psych: euthymic mood, full affect   EKG:  EKG is ordered today. The ekg ordered today demonstrates sinus bradycardia.  Otherwise within normal limits.   Recent Labs: No results found for requested labs within last 365 days.    Lipid Panel    Component Value Date/Time   CHOL 180 11/15/2013 0810   TRIG 54.0 11/15/2013 0810   HDL 41.50 11/15/2013 0810   CHOLHDL 4 11/15/2013 0810   VLDL 10.8 11/15/2013 0810   LDLCALC 128* 11/15/2013 0810   LDLDIRECT 189.8 02/08/2011 0853      Wt Readings from Last 3 Encounters:  11/28/14 169 lb (76.658 kg)  11/15/13 164 lb (74.39 kg)  11/15/12 166 lb (75.297 kg)         ASSESSMENT AND PLAN:  1. ischemic heart disease status post stenting of intermediate branch  on 01/31/03. No recurrent angina 2. Hypercholesterolemia 3. essential hypertension 4. BPH with nocturia  Disposition: Continue on present medications. Recheck in one year for office visit EKG lipid panel hepatic function panel basal metabolic panel and PSA   Current medicines are reviewed at length with the patient today.  The patient does not have concerns regarding medicines.  The following changes have been made:  no change  Labs/ tests ordered today include:   Orders Placed This Encounter  Procedures  . Lipid panel  . Hepatic function panel  . Basic metabolic panel  . PSA  . EKG 12-Lead     Disposition: The patient will continue current medication.  We are  checking lab work today.  Continue exercise.  Return in one year for follow-up office visit lipid panel hepatic function panel basal metabolic panel and PSA.  He will follow-up with Dr. Meda Coffee.  He uses urgent care centers for his PCP  Signed, Darlin Coco MD 11/28/2014 9:59 AM    [facility] [place], [place], [place]  [address] Phone: [phone]; Fax: [phone]

## 2014-11-28 NOTE — Addendum Note (Signed)
Addended by: Eulis Foster on: 11/28/2014 08:32 AM   Modules accepted: Orders

## 2014-11-28 NOTE — Addendum Note (Signed)
Addended by: Eulis Foster on: 11/28/2014 08:30 AM   Modules accepted: Orders

## 2014-11-29 ENCOUNTER — Ambulatory Visit (INDEPENDENT_AMBULATORY_CARE_PROVIDER_SITE_OTHER): Payer: 59 | Admitting: Emergency Medicine

## 2014-11-29 VITALS — BP 152/94 | HR 60 | Temp 97.5°F | Resp 16 | Ht 72.0 in | Wt 170.0 lb

## 2014-11-29 DIAGNOSIS — I2510 Atherosclerotic heart disease of native coronary artery without angina pectoris: Secondary | ICD-10-CM | POA: Diagnosis not present

## 2014-11-29 DIAGNOSIS — I2583 Coronary atherosclerosis due to lipid rich plaque: Principal | ICD-10-CM

## 2014-11-29 LAB — PSA: PSA: 3.7 ng/mL (ref <=4.00)

## 2014-11-29 MED ORDER — ZOSTER VACCINE LIVE 19400 UNT/0.65ML ~~LOC~~ SOLR: 0.6500 mL | Freq: Once | SUBCUTANEOUS | Status: DC

## 2014-11-29 NOTE — Progress Notes (Signed)
This chart was scribed for Arlyss Queen, MD by [name], ED Scribe. This patient was seen in room 13 and the patient's care was started at 8:25 AM.  Chief Complaint:  Chief Complaint  Patient presents with  . Other    Establish care, referral for cardiology    HPI: Kyle Roberts is a 59 y.o. male who reports to [facility] for a referral. Pt recently had a change in insurance. He had annual physical yesterday with cardiology, Dr. Mare Ferrari, whose he has been followed by for 12 years, and was told he needs a referral to continue care. He has hx of HTN. Had a stent placed 11-12 years ago. He is UTD with colonoscopy followed by Dr. Michail Sermon and prostate exam, followed by Dr. Karsten Ro. He decline shingle vaccine and flu shot today.     Past Medical History  Diagnosis Date  . Ischemic heart disease   . Hypertension   . Hypercholesteremia    Past Surgical History  Procedure Laterality Date  . Coronary angioplasty  01/31/2003  . US echocardiography  05/27/09   Social History   Social History  . Marital Status: Married    Spouse Name: N/A  . Number of Children: N/A  . Years of Education: N/A   Social History Main Topics  . Smoking status: Former Smoker    Types: Cigarettes  . Smokeless tobacco: None  . Alcohol Use: None  . Drug Use: None  . Sexual Activity: Not Asked   Other Topics Concern  . None   Social History Narrative   Family History  Problem Relation Age of Onset  . Heart attack Father   . Heart attack Brother    No Known Allergies Prior to Admission medications   Medication Sig Start Date End Date Taking? Authorizing Provider  amLODipine (NORVASC) 5 MG tablet TAKE ONE TABLET BY MOUTH ONE TIME DAILY  12/18/13  Yes Darlin Coco, MD  aspirin 81 MG chewable tablet Chew 81 mg by mouth daily.    Yes Historical Provider, MD  atorvastatin (LIPITOR) 10 MG tablet Take 10 mg by mouth daily.   Yes Historical Provider, MD  MULTIPLE VITAMIN PO Take 1 tablet by mouth  daily.    Yes Historical Provider, MD     ROS: The patient denies fevers, chills, night sweats, unintentional weight loss, chest pain, palpitations, wheezing, dyspnea on exertion, nausea, vomiting, abdominal pain, dysuria, hematuria, melena, numbness, weakness, or tingling.   All other systems have been reviewed and were otherwise negative with the exception of those mentioned in the HPI and as above.    PHYSICAL EXAM: Filed Vitals:   11/29/14 0819  BP: 152/94  Pulse: 60  Temp: 97.5 F (36.4 C)  Resp: 16   Body mass index is 23.05 kg/(m^2).   General: Alert, no acute distress HEENT:  Normocephalic, atraumatic, oropharynx patent. Eye: Kyle Roberts [facility] Cardiovascular:  Regular rate and rhythm, no rubs murmurs or gallops.  No Carotid bruits, radial pulse intact. No pedal edema.  Respiratory: Clear to auscultation bilaterally.  No wheezes, rales, or rhonchi.  No cyanosis, no use of accessory musculature Abdominal: No organomegaly, abdomen is soft and non-tender, positive bowel sounds.  No masses. Musculoskeletal: Gait intact. No edema, tenderness Skin: No rashes. Neurologic: Facial musculature symmetric. Psychiatric: Patient acts appropriately throughout our interaction. Lymphatic: No cervical or submandibular lymphadenopathy   LABS:    EKG/XRAY:   Primary read interpreted by Dr. Everlene Farrier at [facility].   ASSESSMENT/PLAN: Pt status post stent placement  approximately 10 years ago. He needs re-evaluation by cardiologist. Referral made to Dr. Derrill Kay cardiology regarding this. He is UTD regarding colonoscopy. He has renal specialist to see regarding prostate and renal check. He was given a prescription for shingle vaccine.    Gross sideeffects, risk and benefits, and alternatives of medications d/w patient. Patient is aware that all medications have potential sideeffects and we are unable to predict every sideeffect or drug-drug interaction that may occur.  By signing my name below, I,  [name], attest that this documentation has been prepared under the direction and in the presence of Arlyss Queen, MD.  Electronically Signed: [name], ED Scribe. 11/29/2014. 8:35 AM.   Arlyss Queen MD 11/29/2014 8:25 AM

## 2014-11-30 NOTE — Progress Notes (Signed)
Quick Note:  Please report to patient. The recent labs are stable. Continue same medication and careful diet. The PSA is still normal but it has increased rapidly since last year. I would recommend that he see a urologist to be sure he does not have early prostate cancer. ______

## 2015-02-10 ENCOUNTER — Other Ambulatory Visit: Payer: Self-pay | Admitting: Cardiology

## 2015-03-16 ENCOUNTER — Other Ambulatory Visit: Payer: Self-pay | Admitting: Cardiology

## 2015-03-19 MED ORDER — ATORVASTATIN CALCIUM 10 MG PO TABS: 10.0000 mg | ORAL_TABLET | Freq: Every day | ORAL | Status: DC

## 2015-03-19 NOTE — Telephone Encounter (Signed)
Pt called wanting to know if we had sent his Atorvastatin 20 mg in, after looking in his chart we found out that his is actually Atorvastatin 10 mg and he had mixed up the dosage with his wife, refill sent.

## 2015-04-27 ENCOUNTER — Ambulatory Visit (INDEPENDENT_AMBULATORY_CARE_PROVIDER_SITE_OTHER): Payer: BLUE CROSS/BLUE SHIELD | Admitting: Physician Assistant

## 2015-04-27 VITALS — BP 116/70 | HR 58 | Temp 97.7°F | Resp 18 | Ht 72.0 in | Wt 170.4 lb

## 2015-04-27 DIAGNOSIS — J069 Acute upper respiratory infection, unspecified: Secondary | ICD-10-CM

## 2015-04-27 DIAGNOSIS — B9789 Other viral agents as the cause of diseases classified elsewhere: Principal | ICD-10-CM

## 2015-04-27 MED ORDER — HYDROCOD POLST-CPM POLST ER 10-8 MG/5ML PO SUER: 5.0000 mL | Freq: Two times a day (BID) | ORAL | Status: DC | PRN

## 2015-04-27 MED ORDER — BENZONATATE 100 MG PO CAPS: 100.0000 mg | ORAL_CAPSULE | Freq: Three times a day (TID) | ORAL | Status: DC | PRN

## 2015-04-27 NOTE — Progress Notes (Signed)
[facility] [address] [place] [address] [phone]  Date:  04/27/2015   Name:  Kyle Roberts   DOB:  04-Feb-1955   MRN:  [id number]  PCP:  No primary care provider on file.    Chief Complaint: Fatigue and Cough   History of Present Illness:  This is a 60 y.o. male with PMH ischemic heart disease, HTN, HLD who is presenting with 2 days of cough and fatigue. Cough is mostly dry. Says overall he feels ok but wanted to get seen at the start of this illness. Denies sore throat, nasal congestion, fever, chills. Denies sob or wheezing. Has not tried anything for his symptoms yet. No history of asthma or allergies. Former smoker, quit 30 years ago. No sick contacts. His grandchildren are coming in town in 2 weeks and he wants to make sure he is not sick then.  Review of Systems:  Review of Systems See HPI  Patient Active Problem List   Diagnosis Date Noted  . Ischemic heart disease   . Hypertension   . Hypercholesteremia     Prior to Admission medications   Medication Sig Start Date End Date Taking? Authorizing Provider  amLODipine (NORVASC) 5 MG tablet TAKE 1 TABLET BY MOUTH DAILY 02/10/15  Yes Darlin Coco, MD  aspirin 81 MG chewable tablet Chew 81 mg by mouth daily.    Yes Historical Provider, MD  atorvastatin (LIPITOR) 10 MG tablet Take 1 tablet (10 mg total) by mouth daily. 03/19/15  Yes Darlin Coco, MD  MULTIPLE VITAMIN PO Take 1 tablet by mouth daily.    Yes Historical Provider, MD    No Known Allergies  Past Surgical History  Procedure Laterality Date  . Coronary angioplasty  01/31/2003  . US echocardiography  05/27/09    Social History  Substance Use Topics  . Smoking status: Former Smoker    Types: Cigarettes  . Smokeless tobacco: None  . Alcohol Use: None    Family History  Problem Relation Age of Onset  . Heart attack Father   . Heart attack Brother     Medication list has been reviewed and updated.  Physical  Examination:  Physical Exam  Constitutional: He is oriented to person, place, and time. He appears well-developed and well-nourished. No distress.  HENT:  Head: Normocephalic and atraumatic.  Right Ear: Hearing, tympanic membrane, external ear and ear canal normal.  Left Ear: Hearing, tympanic membrane, external ear and ear canal normal.  Nose: Nose normal.  Mouth/Throat: Uvula is midline, oropharynx is clear and moist and mucous membranes are normal.  Eyes: Conjunctivae and lids are normal. Right eye exhibits no discharge. Left eye exhibits no discharge. No scleral icterus.  Cardiovascular: Normal rate, regular rhythm, normal heart sounds and normal pulses.   No murmur heard. Pulmonary/Chest: Effort normal and breath sounds normal. No respiratory distress. He has no wheezes. He has no rhonchi. He has no rales.  Musculoskeletal: Normal range of motion.  Lymphadenopathy:       Head (right side): No submental, no submandibular and no tonsillar adenopathy present.       Head (left side): No submental, no submandibular and no tonsillar adenopathy present.    He has no cervical adenopathy.  Neurological: He is alert and oriented to person, place, and time.  Skin: Skin is warm, dry and intact. No lesion and no rash noted.  Psychiatric: He has a normal mood and affect. His speech is normal and behavior is normal.  Thought content normal.   BP 116/70 mmHg  Pulse 58  Temp(Src) 97.7 F (36.5 C) (Oral)  Resp 18  Ht 6' (1.829 m)  Wt 170 lb 6.4 oz (77.293 kg)  BMI 23.11 kg/m2  SpO2 99%  Assessment and Plan:  1. Viral URI with cough Likely viral etiology, exam and vitals benign. Treat with tessalon and tussionex. He will call if not getting better in 1 week and i would consider rx'ing abx at that time. - benzonatate (TESSALON) 100 MG capsule; Take 1-2 capsules (100-200 mg total) by mouth 3 (three) times daily as needed for cough.  Dispense: 40 capsule; Refill: 0 - chlorpheniramine-HYDROcodone  (TUSSIONEX PENNKINETIC ER) 10-8 MG/5ML SUER; Take 5 mLs by mouth every 12 (twelve) hours as needed for cough.  Dispense: 100 mL; Refill: 0   Benjaman Pott. Drenda Freeze, MHS [facility]  04/27/2015

## 2015-04-27 NOTE — Patient Instructions (Addendum)
Drink plenty of water (64 oz/day) and get plenty of rest. If you have been prescribed a cough syrup, do not drive or operate heavy machinery while using this medication. May use tessalon during the day for cough If your symptoms are not improving in 1 week, call and let me know. I would consider prescribing antibiotics at that time.     IF you received an x-ray today, you will receive an invoice from [facility]. Please contact [facility] at [phone] with questions or concerns regarding your invoice.   IF you received labwork today, you will receive an invoice from [employer]. Please contact [facility] at [phone] with questions or concerns regarding your invoice.   Our billing staff will not be able to assist you with questions regarding bills from these companies.  You will be contacted with the lab results as soon as they are available. The fastest way to get your results is to activate your My Chart account. Instructions are located on the last page of this paperwork. If you have not heard from [place] regarding the results in 2 weeks, please contact this office.

## 2015-05-06 ENCOUNTER — Ambulatory Visit (INDEPENDENT_AMBULATORY_CARE_PROVIDER_SITE_OTHER): Payer: BLUE CROSS/BLUE SHIELD | Admitting: Physician Assistant

## 2015-05-06 VITALS — BP 137/83 | HR 64 | Temp 98.2°F | Ht 72.0 in | Wt 168.0 lb

## 2015-05-06 DIAGNOSIS — J069 Acute upper respiratory infection, unspecified: Secondary | ICD-10-CM

## 2015-05-06 MED ORDER — CEFDINIR 300 MG PO CAPS: 300.0000 mg | ORAL_CAPSULE | Freq: Two times a day (BID) | ORAL | Status: DC

## 2015-05-06 MED ORDER — GUAIFENESIN ER 1200 MG PO TB12: 1.0000 | ORAL_TABLET | Freq: Two times a day (BID) | ORAL | Status: DC | PRN

## 2015-05-06 NOTE — Progress Notes (Signed)
[facility] [address], [place] [address] [phone]  Date:  05/06/2015   Name:  Kyle Roberts   DOB:  09-10-1955   MRN:  [id number]  PCP:  No primary care provider on file.   Chief Complaint  Patient presents with  . Follow-up    seen 9 days ago  . Cough    yellow product - cough meds caused constipation  . Nasal Congestion  . sinus congestion    History of Present Illness:  Kyle Roberts is a 60 y.o. male patient who presents to [facility] for cc of sinus congestion and cough follow up.  He is in day 9 of a dxd viral uri.  He states that he continues to cough, however this has improved.  He has no fever.  He has sore throat and post nasal drip.  There is no rhinorrhea or sneezing.  There is some fatigue.  No sinus pain.  Yesterday he thought he was having bodyaches, but this has since resolved.  He is drinking only little water.  Taking the tussionex and tessalon pearls.  The tussionex caused constipation and he has stopped this med.  He is using miralax which helped.     Patient Active Problem List   Diagnosis Date Noted  . Ischemic heart disease   . Hypertension   . Hypercholesteremia     Past Medical History  Diagnosis Date  . Ischemic heart disease   . Hypertension   . Hypercholesteremia     Past Surgical History  Procedure Laterality Date  . Coronary angioplasty  01/31/2003  . US echocardiography  05/27/09    Social History  Substance Use Topics  . Smoking status: Former Smoker    Types: Cigarettes  . Smokeless tobacco: None  . Alcohol Use: None    Family History  Problem Relation Age of Onset  . Heart attack Father   . Heart attack Brother     No Known Allergies  Medication list has been reviewed and updated.  Current Outpatient Prescriptions on File Prior to Visit  Medication Sig Dispense Refill  . amLODipine (NORVASC) 5 MG tablet TAKE 1 TABLET BY MOUTH DAILY 30 tablet 9  . aspirin 81 MG chewable tablet Chew 81 mg by  mouth daily.     Marland Kitchen atorvastatin (LIPITOR) 10 MG tablet Take 1 tablet (10 mg total) by mouth daily. 90 tablet 2  . benzonatate (TESSALON) 100 MG capsule Take 1-2 capsules (100-200 mg total) by mouth 3 (three) times daily as needed for cough. 40 capsule 0  . chlorpheniramine-HYDROcodone (TUSSIONEX PENNKINETIC ER) 10-8 MG/5ML SUER Take 5 mLs by mouth every 12 (twelve) hours as needed for cough. 100 mL 0  . MULTIPLE VITAMIN PO Take 1 tablet by mouth daily.      No current facility-administered medications on file prior to visit.    ROS ROS otherwise unremarkable unless listed above.   Physical Examination: BP 137/83 mmHg  Pulse 64  Temp(Src) 98.2 F (36.8 C) (Oral)  Ht 6' (1.829 m)  Wt 168 lb (76.204 kg)  BMI 22.78 kg/m2  SpO2 98% Ideal Body Weight: Weight in (lb) to have BMI = 25: 183.9  Physical Exam  Constitutional: He is oriented to person, place, and time. He appears well-developed and well-nourished. No distress.  HENT:  Head: Atraumatic.  Right Ear: Tympanic membrane, external ear and ear canal normal.  Left Ear: Tympanic membrane, external ear and ear canal normal.  Nose: Mucosal edema  and rhinorrhea present. Right sinus exhibits no maxillary sinus tenderness and no frontal sinus tenderness. Left sinus exhibits no maxillary sinus tenderness and no frontal sinus tenderness.  Mouth/Throat: No uvula swelling. Posterior oropharyngeal erythema (shiny consistent with post-nasal drip) present. No oropharyngeal exudate or posterior oropharyngeal edema.  Eyes: Conjunctivae, EOM and lids are normal. Pupils are equal, round, and reactive to light. Right eye exhibits normal extraocular motion. Left eye exhibits normal extraocular motion.  Neck: Trachea normal, normal range of motion and full passive range of motion without pain. No edema and no erythema present. No thyromegaly present.  Cardiovascular: Normal rate, regular rhythm and intact distal pulses.  Exam reveals no friction rub.   No  murmur heard. Pulmonary/Chest: Effort normal. No respiratory distress. He has no decreased breath sounds. He has no wheezes. He has no rhonchi.  Neurological: He is alert and oriented to person, place, and time. No cranial nerve deficit. Coordination normal.  Skin: Skin is warm and dry. He is not diaphoretic.  Psychiatric: He has a normal mood and affect. His behavior is normal.     Assessment and Plan: Kyle Roberts is a 60 y.o. male who is here today for follow up of cough, congestion, and sore throat. This continues to look like a viral uri.  Day 9 and having some improvement. I have advised to start mucinex coupled with hydration.  I will send cefdinir at this time, and advised to wait 24-48 hours.  Educated of anti-bioitic use.    Acute upper respiratory infection - Plan: Guaifenesin (MUCINEX MAXIMUM STRENGTH) 1200 MG TB12, cefdinir (OMNICEF) 300 MG capsule, DISCONTINUED: cefdinir (OMNICEF) 300 MG capsule  Ivar Drape, PA-C [facility] 05/06/2015 8:38 AM

## 2015-05-06 NOTE — Patient Instructions (Addendum)
IF you received an x-ray today, you will receive an invoice from [facility]. Please contact [facility] at [phone] with questions or concerns regarding your invoice.   IF you received labwork today, you will receive an invoice from [employer]. Please contact [facility] at [phone] with questions or concerns regarding your invoice.   Our billing staff will not be able to assist you with questions regarding bills from these companies.  You will be contacted with the lab results as soon as they are available. The fastest way to get your results is to activate your My Chart account. Instructions are located on the last page of this paperwork. If you have not heard from [place] regarding the results in 2 weeks, please contact this office.    Please hydrate more with 64 oz of water or more.  This will help the mucinex to work.  You can use a nasal saline spray for your nostrils. Delsym for cough. I would like you to wait 24-48 hours.  If you are not having improvement, fill the antibiotic--and take to completion.    Upper Respiratory Infection, Adult Most upper respiratory infections (URIs) are a viral infection of the air passages leading to the lungs. A URI affects the nose, throat, and upper air passages. The most common type of URI is nasopharyngitis and is typically referred to as "the common cold." URIs run their course and usually go away on their own. Most of the time, a URI does not require medical attention, but sometimes a bacterial infection in the upper airways can follow a viral infection. This is called a secondary infection. Sinus and middle ear infections are common types of secondary upper respiratory infections. Bacterial pneumonia can also complicate a URI. A URI can worsen asthma and chronic obstructive pulmonary disease (COPD). Sometimes, these complications can require emergency medical care and may be life threatening.   CAUSES Almost all URIs are caused by viruses. A virus is a type of germ and can spread from one person to another.  RISKS FACTORS You may be at risk for a URI if:   You smoke.   You have chronic heart or lung disease.  You have a weakened defense (immune) system.   You are very young or very old.   You have nasal allergies or asthma.  You work in crowded or poorly ventilated areas.  You work in health care facilities or schools. SIGNS AND SYMPTOMS  Symptoms typically develop 2-3 days after you come in contact with a cold virus. Most viral URIs last 7-10 days. However, viral URIs from the influenza virus (flu virus) can last 14-18 days and are typically more severe. Symptoms may include:   Runny or stuffy (congested) nose.   Sneezing.   Cough.   Sore throat.   Headache.   Fatigue.   Fever.   Loss of appetite.   Pain in your forehead, behind your eyes, and over your cheekbones (sinus pain).  Muscle aches.  DIAGNOSIS  Your health care provider may diagnose a URI by:  Physical exam.  Tests to check that your symptoms are not due to another condition such as:  Strep throat.  Sinusitis.  Pneumonia.  Asthma. TREATMENT  A URI goes away on its own with time. It cannot be cured with medicines, but medicines may be prescribed or recommended to relieve symptoms. Medicines may help:  Reduce your fever.  Reduce your cough.  Relieve nasal congestion. HOME CARE INSTRUCTIONS   Take medicines only  as directed by your health care provider.   Gargle warm saltwater or take cough drops to comfort your throat as directed by your health care provider.  Use a warm mist humidifier or inhale steam from a shower to increase air moisture. This may make it easier to breathe.  Drink enough fluid to keep your urine clear or pale yellow.   Eat soups and other clear broths and maintain good nutrition.   Rest as needed.   Return to work when your temperature  has returned to normal or as your health care provider advises. You may need to stay home longer to avoid infecting others. You can also use a face mask and careful hand washing to prevent spread of the virus.  Increase the usage of your inhaler if you have asthma.   Do not use any tobacco products, including cigarettes, chewing tobacco, or electronic cigarettes. If you need help quitting, ask your health care provider. PREVENTION  The best way to protect yourself from getting a cold is to practice good hygiene.   Avoid oral or hand contact with people with cold symptoms.   Wash your hands often if contact occurs.  There is no clear evidence that vitamin C, vitamin E, echinacea, or exercise reduces the chance of developing a cold. However, it is always recommended to get plenty of rest, exercise, and practice good nutrition.  SEEK MEDICAL CARE IF:   You are getting worse rather than better.   Your symptoms are not controlled by medicine.   You have chills.  You have worsening shortness of breath.  You have brown or red mucus.  You have yellow or brown nasal discharge.  You have pain in your face, especially when you bend forward.  You have a fever.  You have swollen neck glands.  You have pain while swallowing.  You have white areas in the back of your throat. SEEK IMMEDIATE MEDICAL CARE IF:   You have severe or persistent:  Headache.  Ear pain.  Sinus pain.  Chest pain.  You have chronic lung disease and any of the following:  Wheezing.  Prolonged cough.  Coughing up blood.  A change in your usual mucus.  You have a stiff neck.  You have changes in your:  Vision.  Hearing.  Thinking.  Mood. MAKE SURE YOU:   Understand these instructions.  Will watch your condition.  Will get help right away if you are not doing well or get worse.   This information is not intended to replace advice given to you by your health care provider. Make sure  you discuss any questions you have with your health care provider.   Document Released: 06/29/2000 Document Revised: 05/20/2014 Document Reviewed: 04/10/2013 Elsevier Interactive Patient Education [employer].

## 2015-06-18 DIAGNOSIS — Z86010 Personal history of colonic polyps: Secondary | ICD-10-CM | POA: Diagnosis not present

## 2015-06-18 DIAGNOSIS — K648 Other hemorrhoids: Secondary | ICD-10-CM | POA: Diagnosis not present

## 2015-06-18 DIAGNOSIS — K5900 Constipation, unspecified: Secondary | ICD-10-CM | POA: Diagnosis not present

## 2015-06-25 ENCOUNTER — Telehealth: Payer: Self-pay | Admitting: Cardiology

## 2015-06-25 NOTE — Telephone Encounter (Signed)
Called the pt x 2 and he states he is too busy to talk and will follow-up with our office tomorrow.

## 2015-06-25 NOTE — Telephone Encounter (Signed)
New message     Former Dr Mare Ferrari pt.  Talk to the nurse.  Pt had a stent 16yrs ago.  Patient said he thinks he needs another stent (according to the way he feels).  He would not go into details on how he feels----he want to talk it over with a nurse

## 2015-06-26 NOTE — Telephone Encounter (Signed)
Pt did not make a return call back to the office today.  Will close this encounter and addend it as needed.

## 2015-08-03 DIAGNOSIS — Z86010 Personal history of colonic polyps: Secondary | ICD-10-CM | POA: Diagnosis not present

## 2015-08-03 DIAGNOSIS — D125 Benign neoplasm of sigmoid colon: Secondary | ICD-10-CM | POA: Diagnosis not present

## 2015-08-03 DIAGNOSIS — K640 First degree hemorrhoids: Secondary | ICD-10-CM | POA: Diagnosis not present

## 2015-08-03 DIAGNOSIS — D126 Benign neoplasm of colon, unspecified: Secondary | ICD-10-CM | POA: Diagnosis not present

## 2015-08-03 DIAGNOSIS — K5730 Diverticulosis of large intestine without perforation or abscess without bleeding: Secondary | ICD-10-CM | POA: Diagnosis not present

## 2015-08-18 ENCOUNTER — Encounter: Payer: Self-pay | Admitting: Physician Assistant

## 2015-08-18 DIAGNOSIS — D125 Benign neoplasm of sigmoid colon: Secondary | ICD-10-CM | POA: Insufficient documentation

## 2015-08-18 DIAGNOSIS — K5730 Diverticulosis of large intestine without perforation or abscess without bleeding: Secondary | ICD-10-CM | POA: Insufficient documentation

## 2015-08-27 DIAGNOSIS — R972 Elevated prostate specific antigen [PSA]: Secondary | ICD-10-CM | POA: Diagnosis not present

## 2015-09-03 DIAGNOSIS — R972 Elevated prostate specific antigen [PSA]: Secondary | ICD-10-CM | POA: Diagnosis not present

## 2015-09-03 DIAGNOSIS — N400 Enlarged prostate without lower urinary tract symptoms: Secondary | ICD-10-CM | POA: Diagnosis not present

## 2015-09-11 ENCOUNTER — Encounter: Payer: Self-pay | Admitting: Physician Assistant

## 2015-09-11 DIAGNOSIS — N400 Enlarged prostate without lower urinary tract symptoms: Secondary | ICD-10-CM | POA: Insufficient documentation

## 2016-01-20 ENCOUNTER — Telehealth: Payer: Self-pay

## 2016-01-20 NOTE — Telephone Encounter (Signed)
Pt questions addressed. Advised to schedule office visit with Dr. Carlota Raspberry or Dr. Raeford Razor for right wrist pain and swelling

## 2016-01-20 NOTE — Telephone Encounter (Signed)
Pt has an ortho question about his right wrist  Best number [phone]

## 2016-01-23 ENCOUNTER — Ambulatory Visit (INDEPENDENT_AMBULATORY_CARE_PROVIDER_SITE_OTHER): Payer: BLUE CROSS/BLUE SHIELD

## 2016-01-23 ENCOUNTER — Ambulatory Visit (INDEPENDENT_AMBULATORY_CARE_PROVIDER_SITE_OTHER): Payer: BLUE CROSS/BLUE SHIELD | Admitting: Family Medicine

## 2016-01-23 VITALS — BP 124/80 | HR 68 | Temp 98.6°F | Resp 18 | Ht 72.0 in | Wt 168.0 lb

## 2016-01-23 DIAGNOSIS — M25531 Pain in right wrist: Secondary | ICD-10-CM

## 2016-01-23 NOTE — Patient Instructions (Addendum)
  I will refer you to hand specialist to determine if physical therapy or other imaging may be needed for your pain. Let me know if there are questions in the meantime.     IF you received an x-ray today, you will receive an invoice from [facility]. Please contact [facility] at [phone] with questions or concerns regarding your invoice.   IF you received labwork today, you will receive an invoice from [facility]. Please contact [facility] at [phone] with questions or concerns regarding your invoice.   Our billing staff will not be able to assist you with questions regarding bills from these companies.  You will be contacted with the lab results as soon as they are available. The fastest way to get your results is to activate your My Chart account. Instructions are located on the last page of this paperwork. If you have not heard from [place] regarding the results in 2 weeks, please contact this office.

## 2016-01-23 NOTE — Progress Notes (Signed)
Subjective:  By signing my name below, I, [name], attest that this documentation has been prepared under the direction and in the presence of [name], MD.  Electronically Signed: [name], ED Scribe. 01/23/2016. 3:53 PM.   Patient ID: Kyle Roberts, male    DOB: 1955/12/06, 61 y.o.   MRN: [id number]  HPI Chief Complaint  Patient presents with  . Wrist Pain    wants xray    HPI Comments: RONDALE Roberts is a 61 y.o. male who presents to the [facility] complaining of right wrist injury that occurred 6 weeks ago. Pt states right wrist hit along a tight piece of metal 6 weeks ago. At that time, an EMT looked at wrist. Pt has continued to have soreness in right wrist, more prominent in the mornings and with writing. Pt tried occasional ibuprofen and ice. He's had no bruising, swelling or redness. He denies numbness and tingling.  Pt is right hand dominant.   Pt works as a Archivist.   Patient Active Problem List   Diagnosis Date Noted  . BPH without obstruction/lower urinary tract symptoms 09/11/2015  . Benign neoplasm of sigmoid colon 08/18/2015  . Diverticulosis of large intestine without perforation or abscess without bleeding 08/18/2015  . Ischemic heart disease   . Hypertension   . Hypercholesteremia    Past Medical History:  Diagnosis Date  . Hypercholesteremia   . Hypertension   . Ischemic heart disease    Past Surgical History:  Procedure Laterality Date  . CORONARY ANGIOPLASTY  01/31/2003  . US ECHOCARDIOGRAPHY  05/27/09   No Known Allergies Prior to Admission medications   Medication Sig Start Date End Date Taking? Authorizing Provider  amLODipine (NORVASC) 5 MG tablet TAKE 1 TABLET BY MOUTH DAILY 02/10/15  Yes Darlin Coco, MD  aspirin 81 MG chewable tablet Chew 81 mg by mouth daily.    Yes Historical Provider, MD  atorvastatin (LIPITOR) 10 MG tablet Take 1 tablet (10 mg total) by mouth daily. Patient not taking: Reported on  01/23/2016 03/19/15   Darlin Coco, MD   Social History   Social History  . Marital status: Married    Spouse name: N/A  . Number of children: N/A  . Years of education: N/A   Occupational History  . Not on file.   Social History Main Topics  . Smoking status: Former Smoker    Types: Cigarettes  . Smokeless tobacco: Never Used  . Alcohol use Not on file  . Drug use: Unknown  . Sexual activity: Not on file   Other Topics Concern  . Not on file   Social History Narrative  . No narrative on file    Review of Systems  Musculoskeletal: Positive for arthralgias and myalgias.  Skin: Negative for color change and wound.  Neurological: Negative for weakness and numbness.    Objective:   Physical Exam  Constitutional: He is oriented to person, place, and time. He appears well-developed and well-nourished. No distress.  HENT:  Head: Normocephalic and atraumatic.  Eyes: Conjunctivae and EOM are normal.  Neck: Neck supple.  Cardiovascular: Normal rate.   Pulses:      Radial pulses are 2+ on the right side.  Pulmonary/Chest: Effort normal.  Musculoskeletal: Normal range of motion.  Minimal tenderness with percussion over radial styloid.  DRUJ non tender. Slight tenderness with percussion over ulnar styloid.  Full ROm at fingers. 5th phalanx PIP held a slight flexion, but extension and flexion intact  in all fingers. Negative Finkelstein. Negative tinels. Minimal decrease extension 5-10 degrees.  minimal decrease flexion 5-10 degrees. Equal ulnar and radial deviations.  NVI.   Neurological: He is alert and oriented to person, place, and time.  Skin: Skin is warm and dry.  Psychiatric: He has a normal mood and affect. His behavior is normal.  Nursing note and vitals reviewed.  Vitals:   01/23/16 1544  BP: 124/80  Pulse: 68  Resp: 18  Temp: 98.6 F (37 C)  TempSrc: Oral  SpO2: 98%  Weight: 168 lb (76.2 kg)  Height: 6' (1.829 m)   [name] Complete Right  Result Date:  01/23/2016 CLINICAL DATA:  Pain EXAM: RIGHT WRIST - COMPLETE 3+ VIEW COMPARISON:  None. FINDINGS: Frontal, oblique, lateral, and ulnar deviation scaphoid images were obtained. No fracture or dislocation. Joint spaces appear normal. There is benign-appearing cystic change in the distal scaphoid bone. No erosive change. IMPRESSION: Benign cystic change in the distal scaphoid. No fracture or dislocation. No appreciable joint space narrowing or erosion. Electronically Signed   By: [name] M.D.   On: 01/23/2016 16:26     Assessment & Plan:   Kyle Roberts is a 61 y.o. male Right wrist pain - Plan: [name] Complete Right, Ambulatory referral to Hand Surgery  - Persistent right wrist pain over radial and ulnar styloids after metal contact to areas 6 weeks ago (specifics were not given). No known fall or specific injury. X-ray with cystic change of scaphoid, but otherwise no bony findings.  - Due to persistent pain, refer to orthopedic hand specialist to determine if trial of physical therapy versus advanced imaging as next step.  - Range of motion as tolerated, episodic Tylenol or rare nsaid with history of heart disease was discussed.  No orders of the defined types were placed in this encounter.  Patient Instructions    I will refer you to hand specialist to determine if physical therapy or other imaging may be needed for your pain. Let me know if there are questions in the meantime.     IF you received an x-ray today, you will receive an invoice from [facility]. Please contact [facility] at [phone] with questions or concerns regarding your invoice.   IF you received labwork today, you will receive an invoice from [facility]. Please contact [facility] at [phone] with questions or concerns regarding your invoice.   Our billing staff will not be able to assist you with questions regarding bills from these companies.  You will be contacted with the lab  results as soon as they are available. The fastest way to get your results is to activate your My Chart account. Instructions are located on the last page of this paperwork. If you have not heard from [place] regarding the results in 2 weeks, please contact this office.       I personally performed the services described in this documentation, which was scribed in my presence. The recorded information has been reviewed and considered, and addended by me as needed.   Signed,   [name], MD [facility] [place].  01/23/16 5:39 PM

## 2016-01-27 ENCOUNTER — Ambulatory Visit (INDEPENDENT_AMBULATORY_CARE_PROVIDER_SITE_OTHER): Payer: BLUE CROSS/BLUE SHIELD | Admitting: Cardiology

## 2016-01-27 VITALS — BP 126/78 | HR 63 | Ht 72.0 in | Wt 166.0 lb

## 2016-01-27 DIAGNOSIS — E7800 Pure hypercholesterolemia, unspecified: Secondary | ICD-10-CM | POA: Diagnosis not present

## 2016-01-27 DIAGNOSIS — E785 Hyperlipidemia, unspecified: Secondary | ICD-10-CM | POA: Diagnosis not present

## 2016-01-27 DIAGNOSIS — I259 Chronic ischemic heart disease, unspecified: Secondary | ICD-10-CM | POA: Diagnosis not present

## 2016-01-27 DIAGNOSIS — I2510 Atherosclerotic heart disease of native coronary artery without angina pectoris: Secondary | ICD-10-CM

## 2016-01-27 DIAGNOSIS — E7849 Other hyperlipidemia: Secondary | ICD-10-CM

## 2016-01-27 DIAGNOSIS — E784 Other hyperlipidemia: Secondary | ICD-10-CM | POA: Diagnosis not present

## 2016-01-27 DIAGNOSIS — I119 Hypertensive heart disease without heart failure: Secondary | ICD-10-CM | POA: Diagnosis not present

## 2016-01-27 NOTE — Patient Instructions (Signed)
Medication Instructions:   Your physician recommends that you continue on your current medications as directed. Please refer to the Current Medication list given to you today.   Labwork:  TODAY--CMET, CBC W DIFF, TSH, LIPIDS, HEMOGLOBIN A1C     Follow-Up:  Your physician wants you to follow-up in: [place] will receive a reminder letter in the mail two months in advance. If you don't receive a letter, please call our office to schedule the follow-up appointment.        If you need a refill on your cardiac medications before your next appointment, please call your pharmacy.

## 2016-01-27 NOTE — Progress Notes (Signed)
Cardiology Office Note    Date:  01/27/2016   ID:  Kyle Roberts, DOB August 01, 1955, MRN [id number]  PCP:  No PCP Per Patient  Cardiologist:  Dr Mare Ferrari --> Ena Dawley, MD    History of Present Illness:  Kyle Roberts is a 61 y.o. male seen for a one-year followup office visit. He has a past history of known ischemic heart disease. He underwent stenting of an intermediate branch on 01/31/03. He had a nuclear stress test 01/02/08 showing no ischemia and normal LV function. Since last visit he has been doing well. He continues to walk almost daily for exercise.  He denies any chest pain or shortness of breath or palpitations.  01/27/2016 - the patient is coming after one year, he has previously followed with Dr. Mare Ferrari for coronary artery disease and stent placement in 2005. He has been doing great except for an episode of chest pain in June due to acute at rest, resolved and he had no recurrence since. He continues to work and is very active and walks 3 times a week with his wife about a mile. He has no chest pain shortness of breath no dyspnea on exertion no palpitation or claudication or lower extremity edema. The only change to his regimen is that he stop taking Lipitor mostly for potential side effects that he hasn't experienced and started squid oil instead. His diet is healthy.   Past Medical History:  Diagnosis Date  . Hypercholesteremia   . Hypertension   . Ischemic heart disease     Past Surgical History:  Procedure Laterality Date  . CORONARY ANGIOPLASTY  01/31/2003  . US ECHOCARDIOGRAPHY  05/27/09    Current Medications: Outpatient Medications Prior to Visit  Medication Sig Dispense Refill  . amLODipine (NORVASC) 5 MG tablet TAKE 1 TABLET BY MOUTH DAILY 30 tablet 9  . aspirin 81 MG chewable tablet Chew 81 mg by mouth daily.     Marland Kitchen atorvastatin (LIPITOR) 10 MG tablet Take 1 tablet (10 mg total) by mouth daily. (Patient not taking: Reported on 01/27/2016) 90 tablet  2   No facility-administered medications prior to visit.      Allergies:   Patient has no known allergies.   Social History   Social History  . Marital status: Married    Spouse name: N/A  . Number of children: N/A  . Years of education: N/A   Social History Main Topics  . Smoking status: Former Smoker    Types: Cigarettes  . Smokeless tobacco: Never Used  . Alcohol use Not on file  . Drug use: Unknown  . Sexual activity: Not on file   Other Topics Concern  . Not on file   Social History Narrative  . No narrative on file     Family History:  The patient's family history includes Heart attack in his brother and father.   ROS:   Please see the history of present illness.    ROS All other systems reviewed and are negative.   PHYSICAL EXAM:   VS:  BP 126/78   Pulse 63   Ht 6' (1.829 m)   Wt 166 lb (75.3 kg)   BMI 22.51 kg/m    GEN: Well nourished, well developed, in no acute distress  HEENT: normal  Neck: no JVD, carotid bruits, or masses Cardiac: RRR; no murmurs, rubs, or gallops,no edema  Respiratory:  clear to auscultation bilaterally, normal work of breathing GI: soft, nontender, nondistended, + BS MS: no deformity  or atrophy  Skin: warm and dry, no rash Neuro:  Alert and Oriented x 3, Strength and sensation are intact Psych: euthymic mood, full affect  Wt Readings from Last 3 Encounters:  01/27/16 166 lb (75.3 kg)  01/23/16 168 lb (76.2 kg)  05/06/15 168 lb (76.2 kg)      Studies/Labs Reviewed:   EKG:  EKG is ordered today.  The ekg ordered today demonstrates SR, normal ECG, unchanged from prior.  Recent Labs: No results found for requested labs within last 8760 hours.   Lipid Panel    Component Value Date/Time   CHOL 178 11/28/2014 0905   TRIG 80 11/28/2014 0905   HDL 42 11/28/2014 0905   CHOLHDL 4.2 11/28/2014 0905   VLDL 16 11/28/2014 0905   LDLCALC 120 11/28/2014 0905   LDLDIRECT 189.8 02/08/2011 0853       ASSESSMENT:    1.  Hypercholesteremia   2. Hypertensive heart disease without heart failure   3. Other hyperlipidemia   4. Ischemic heart disease   5. Coronary artery disease involving native coronary artery of native heart without angina pectoris    PLAN:  In order of problems listed above:  1. The patient is asymptomatic his EKG is normal and unchanged from prior. Only concern is that he stopped taking atorvastatin, we will check his lipids and advise him further. Is reluctant to take statins so we can potentially advise red yeast Rice in addition to fish oil he is already taking. 2. His blood pressures well controlled on current regimen. 3. Hyperlipidemia - as above.  We will check CMP, lipids, CBC, TSH, hemoglobin A1c today. Follow-up in one year.   Medication Adjustments/Labs and Tests Ordered: Current medicines are reviewed at length with the patient today.  Concerns regarding medicines are outlined above.  Medication changes, Labs and Tests ordered today are listed in the Patient Instructions below. Patient Instructions  Medication Instructions:   Your physician recommends that you continue on your current medications as directed. Please refer to the Current Medication list given to you today.   Labwork:  TODAY--CMET, CBC W DIFF, TSH, LIPIDS, HEMOGLOBIN A1C     Follow-Up:  Your physician wants you to follow-up in: [employer] will receive a reminder letter in the mail two months in advance. If you don't receive a letter, please call our office to schedule the follow-up appointment.        If you need a refill on your cardiac medications before your next appointment, please call your pharmacy.      Signed, Ena Dawley, MD  01/27/2016 10:28 AM    [address], [place], [place]  [address] Phone: [phone]; Fax: [phone]

## 2016-01-28 ENCOUNTER — Telehealth: Payer: Self-pay

## 2016-01-28 ENCOUNTER — Telehealth: Payer: Self-pay | Admitting: *Deleted

## 2016-01-28 ENCOUNTER — Ambulatory Visit: Payer: Self-pay | Admitting: Cardiology

## 2016-01-28 DIAGNOSIS — E7800 Pure hypercholesterolemia, unspecified: Secondary | ICD-10-CM | POA: Insufficient documentation

## 2016-01-28 LAB — COMPREHENSIVE METABOLIC PANEL
ALT: 23 IU/L (ref 0–44)
AST: 23 IU/L (ref 0–40)
Albumin/Globulin Ratio: 1.4 (ref 1.2–2.2)
Albumin: 4.2 g/dL (ref 3.6–4.8)
Alkaline Phosphatase: 50 IU/L (ref 39–117)
BUN/Creatinine Ratio: 22 (ref 10–24)
BUN: 19 mg/dL (ref 8–27)
Bilirubin Total: 0.6 mg/dL (ref 0.0–1.2)
CO2: 24 mmol/L (ref 18–29)
Calcium: 9.6 mg/dL (ref 8.6–10.2)
Chloride: 102 mmol/L (ref 96–106)
Creatinine, Ser: 0.85 mg/dL (ref 0.76–1.27)
GFR calc Af Amer: 108 mL/min/{1.73_m2} (ref >59)
GFR calc non Af Amer: 93 mL/min/{1.73_m2} (ref >59)
Globulin, Total: 3 g/dL (ref 1.5–4.5)
Glucose: 109 mg/dL — ABNORMAL HIGH (ref 65–99)
Potassium: 4.2 mmol/L (ref 3.5–5.2)
Sodium: 143 mmol/L (ref 134–144)
Total Protein: 7.2 g/dL (ref 6.0–8.5)

## 2016-01-28 LAB — CBC WITH DIFFERENTIAL/PLATELET
Basophils Absolute: 0 10*3/uL (ref 0.0–0.2)
Basos: 0 %
EOS (ABSOLUTE): 0.2 10*3/uL (ref 0.0–0.4)
Eos: 2 %
Hematocrit: 46.4 % (ref 37.5–51.0)
Hemoglobin: 16.2 g/dL (ref 13.0–17.7)
Immature Grans (Abs): 0 10*3/uL (ref 0.0–0.1)
Immature Granulocytes: 0 %
Lymphocytes Absolute: 2.4 10*3/uL (ref 0.7–3.1)
Lymphs: 24 %
MCH: 31.3 pg (ref 26.6–33.0)
MCHC: 34.9 g/dL (ref 31.5–35.7)
MCV: 90 fL (ref 79–97)
Monocytes Absolute: 0.8 10*3/uL (ref 0.1–0.9)
Monocytes: 7 %
Neutrophils Absolute: 6.7 10*3/uL (ref 1.4–7.0)
Neutrophils: 67 %
Platelets: 180 10*3/uL (ref 150–379)
RBC: 5.18 x10E6/uL (ref 4.14–5.80)
RDW: 12.9 % (ref 12.3–15.4)
WBC: 10.1 10*3/uL (ref 3.4–10.8)

## 2016-01-28 LAB — TSH: TSH: 1.81 u[IU]/mL (ref 0.450–4.500)

## 2016-01-28 LAB — LIPID PANEL
Chol/HDL Ratio: 5.5 ratio units — ABNORMAL HIGH (ref 0.0–5.0)
Cholesterol, Total: 255 mg/dL — ABNORMAL HIGH (ref 100–199)
HDL: 46 mg/dL (ref >39)
LDL Calculated: 193 mg/dL — ABNORMAL HIGH (ref 0–99)
Triglycerides: 82 mg/dL (ref 0–149)
VLDL Cholesterol Cal: 16 mg/dL (ref 5–40)

## 2016-01-28 LAB — HEMOGLOBIN A1C
Est. average glucose Bld gHb Est-mCnc: 123 mg/dL
Hgb A1c MFr Bld: 5.9 % — ABNORMAL HIGH (ref 4.8–5.6)

## 2016-01-28 MED ORDER — ROSUVASTATIN CALCIUM 10 MG PO TABS: 10.0000 mg | ORAL_TABLET | Freq: Every day | ORAL | 3 refills | Status: DC

## 2016-01-28 NOTE — Telephone Encounter (Signed)
Notified the pt that per Dr Meda Coffee, the pt had normal labs, other than severely elevated LDL at 193.  Informed the pt that per Dr Meda Coffee, she recommends that we start him on Rosuvastatin 10 mg po daily, and repeat cmet and lipids in 2 months.  Confirmed the pharmacy of choice with the pt.  Scheduled the pt a lab appt in 2 months for 03/28/16, to check a cmet and lipids.  Pt is aware to come fasting to this lab appt.  Pt verbalized understanding and agrees with this plan.

## 2016-01-28 NOTE — Telephone Encounter (Signed)
-----   Message from Dorothy Spark, MD sent at 01/28/2016 11:57 AM EST ----- Normal labs other than severely elevated LDL 193, I would start rosuvastatin 10 mgpo daily and repeat CMP and lipids in 2 months.

## 2016-01-28 NOTE — Telephone Encounter (Signed)
Patient called to leave a message with Carlota Raspberry and see if he could give him a call back.  He was seen on the 6th about wrist pain and Dr. Carlota Raspberry had given him a referral to a specialist.  Patient just had a few questions about this process and wanted to discuss some concerns with him.  Please advise  [phone]

## 2016-01-29 NOTE — Telephone Encounter (Signed)
1 

## 2016-01-30 NOTE — Telephone Encounter (Signed)
I am out of the office today, but other clinical staff member can try to answer questions for him. If he wants to just talk to me can sign up for Mychart and send email or I can call him next week. Let me know.

## 2016-02-01 ENCOUNTER — Telehealth: Payer: Self-pay

## 2016-02-01 NOTE — Telephone Encounter (Signed)
PATIENT STATES HE SAW DR. Carlota Raspberry ON Saturday FOR HIS (R) WRIST. HE HAS AN APPOINTMENT TOMORROW MORNING (02/02/16) AT 7:45 AM. THEIR OFFICE TOLD HIM TO BRING HIS X-RAYS. PLEASE CALL HIM WHEN THEY CAN BE PICKED UP. HIS APPOINTMENT IS AT [place]. (I HAD TO SEND THIS MESSAGE TO THE CLINICAL POOL BECAUSE I DIDN'T HAVE A CHOICE FOR X-RAY) BEST PHONE [phone] (CELL) [place]

## 2016-02-01 NOTE — Telephone Encounter (Signed)
Seeing specialist for this, no worries

## 2016-02-02 DIAGNOSIS — S63591A Other specified sprain of right wrist, initial encounter: Secondary | ICD-10-CM | POA: Diagnosis not present

## 2016-02-02 NOTE — Telephone Encounter (Signed)
At the patient's request, I made a copy of his x-ray on CD , and placed in the pick up drawer.

## 2016-02-09 ENCOUNTER — Ambulatory Visit: Payer: BLUE CROSS/BLUE SHIELD | Attending: Family Medicine | Admitting: Physical Therapy

## 2016-02-09 ENCOUNTER — Encounter: Payer: Self-pay | Admitting: Physical Therapy

## 2016-02-09 DIAGNOSIS — M25531 Pain in right wrist: Secondary | ICD-10-CM | POA: Insufficient documentation

## 2016-02-09 DIAGNOSIS — M25631 Stiffness of right wrist, not elsewhere classified: Secondary | ICD-10-CM | POA: Diagnosis not present

## 2016-02-09 NOTE — Therapy (Signed)
[place] [place], [place], [address] Phone: [phone]   Fax:  [phone]  Physical Therapy Evaluation  Patient Details  Name: Kyle Roberts MRN: [id number] Date of Birth: Mar 14, 1955 Referring Provider: Chinita Pester  Encounter Date: 02/09/2016      PT End of Session - 02/09/16 0840    Visit Number 1   Date for PT Re-Evaluation 04/08/16   PT Start Time 0800   PT Stop Time 0840   PT Time Calculation (min) 40 min   Activity Tolerance Patient tolerated treatment well   Behavior During Therapy [facility] for tasks assessed/performed      Past Medical History:  Diagnosis Date  . Hypercholesteremia   . Hypertension   . Ischemic heart disease     Past Surgical History:  Procedure Laterality Date  . CORONARY ANGIOPLASTY  01/31/2003  . US ECHOCARDIOGRAPHY  05/27/09    There were no vitals filed for this visit.       Subjective Assessment - 02/09/16 0812    Subjective Patient reports that the day before Thanksgiving he injured his right wrist.  He reports that his hand has had pain and decreased ROM.  He reports that x-rays were negative.  He reports that he feels his hand is weak   Limitations Lifting   Patient Stated Goals have better ROM and no pain   Currently in Pain? Yes   Pain Score 0-No pain   Pain Location Wrist   Pain Orientation Right   Aggravating Factors  certain motions will increase the pain to a 4/10, he reports lifting his grandson caused the most pain   Pain Relieving Factors not moving   Effect of Pain on Daily Activities just some pain and decreased motions            [facility] PT Assessment - 02/09/16 0001      Assessment   Medical Diagnosis right wrist pain   Referring Provider Chinita Pester   Onset Date/Surgical Date 12/10/15   Hand Dominance Right     Precautions   Precautions None     Balance Screen   Has the patient fallen in the past 6 months No   Has the  patient had a decrease in activity level because of a fear of falling?  No   Is the patient reluctant to leave their home because of a fear of falling?  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   Leisure walks for exercise     Posture/Postural Control   Posture Comments fwd head, rounded shoulders     ROM / Strength   AROM / PROM / Strength AROM;PROM;Strength     AROM   AROM Assessment Site Wrist   Right/Left Wrist Right   Right Wrist Extension 50 Degrees   Right Wrist Flexion 55 Degrees   Right Wrist Radial Deviation 15 Degrees   Right Wrist Ulnar Deviation 15 Degrees     PROM   PROM Assessment Site Wrist   Right/Left Wrist Right   Right Wrist Extension 73 Degrees   Right Wrist Flexion 65 Degrees   Right Wrist Radial Deviation 20 Degrees   Right Wrist Ulnar Deviation 20 Degrees     Strength   Overall Strength Comments wrist 4/5, grip is  WFL's without pain     Palpation   Palpation comment tender along the RC and UC joints                           PT Education - 02/09/16 0839    Education provided Yes   Education Details Gave HEP for wrist flexibility, for wrist strength   Person(s) Educated Patient   Methods Explanation;Demonstration;Handout   Comprehension Verbalized understanding;Returned demonstration;Verbal cues required             PT Long Term Goals - 02/09/16 0845      PT LONG TERM GOAL #1   Title independent with initial HEP   Time 4   Period Weeks   Status New               Plan - 02/09/16 0841    Clinical Impression Statement Patient injured the right wrist at Thanksgiving, he reports that since that time his pain has improved but he reports difficulty with some ADL's especially lifting a grandchild is difficult and painful.  He is tight with flexion and extension of  the wrist, RD and UD seem to cause the most pain he   Rehab Potential Good   PT Frequency 1x / week   PT Duration 4 weeks   PT Treatment/Interventions ADLs/Self Care Home Management;Electrical Stimulation;Patient/family education;Therapeutic exercise;Therapeutic activities   PT Next Visit Plan Patient feels like he understands and wants to try on own since the deductible is not met   Consulted and Agree with Plan of Care Patient      Patient will benefit from skilled therapeutic intervention in order to improve the following deficits and impairments:  Decreased activity tolerance, Decreased range of motion, Decreased strength, Impaired flexibility, Pain  Visit Diagnosis: Pain in right wrist - Plan: PT plan of care cert/re-cert  Stiffness of right wrist, not elsewhere classified - Plan: PT plan of care cert/re-cert     Problem List Patient Active Problem List   Diagnosis Date Noted  . Elevated LDL cholesterol level 01/28/2016  . BPH without obstruction/lower urinary tract symptoms 09/11/2015  . Benign neoplasm of sigmoid colon 08/18/2015  . Diverticulosis of large intestine without perforation or abscess without bleeding 08/18/2015  . Ischemic heart disease   . Hypertension   . Hypercholesteremia     Sumner Boast., PT 02/09/2016, 8:49 AM  [address] [place], [place], [address] Phone: [phone]   Fax:  [phone]  Name: Kyle Roberts MRN: [id number] Date of Birth: 01/19/1955

## 2016-03-28 ENCOUNTER — Other Ambulatory Visit: Payer: BLUE CROSS/BLUE SHIELD

## 2016-03-30 ENCOUNTER — Other Ambulatory Visit: Payer: BLUE CROSS/BLUE SHIELD | Admitting: *Deleted

## 2016-03-30 ENCOUNTER — Encounter (INDEPENDENT_AMBULATORY_CARE_PROVIDER_SITE_OTHER): Payer: Self-pay

## 2016-03-30 DIAGNOSIS — E7800 Pure hypercholesterolemia, unspecified: Secondary | ICD-10-CM

## 2016-03-30 LAB — LIPID PANEL
Chol/HDL Ratio: 3.8 ratio units (ref 0.0–5.0)
Cholesterol, Total: 168 mg/dL (ref 100–199)
HDL: 44 mg/dL (ref >39)
LDL Calculated: 112 mg/dL — ABNORMAL HIGH (ref 0–99)
Triglycerides: 62 mg/dL (ref 0–149)
VLDL Cholesterol Cal: 12 mg/dL (ref 5–40)

## 2016-03-30 LAB — COMPREHENSIVE METABOLIC PANEL
ALT: 36 IU/L (ref 0–44)
AST: 33 IU/L (ref 0–40)
Albumin/Globulin Ratio: 1.5 (ref 1.2–2.2)
Albumin: 4.4 g/dL (ref 3.6–4.8)
Alkaline Phosphatase: 48 IU/L (ref 39–117)
BUN/Creatinine Ratio: 18 (ref 10–24)
BUN: 16 mg/dL (ref 8–27)
Bilirubin Total: 0.8 mg/dL (ref 0.0–1.2)
CO2: 25 mmol/L (ref 18–29)
Calcium: 9.3 mg/dL (ref 8.6–10.2)
Chloride: 100 mmol/L (ref 96–106)
Creatinine, Ser: 0.87 mg/dL (ref 0.76–1.27)
GFR calc Af Amer: 107 mL/min/{1.73_m2} (ref >59)
GFR calc non Af Amer: 92 mL/min/{1.73_m2} (ref >59)
Globulin, Total: 2.9 g/dL (ref 1.5–4.5)
Glucose: 117 mg/dL — ABNORMAL HIGH (ref 65–99)
Potassium: 4 mmol/L (ref 3.5–5.2)
Sodium: 139 mmol/L (ref 134–144)
Total Protein: 7.3 g/dL (ref 6.0–8.5)

## 2016-04-04 ENCOUNTER — Telehealth: Payer: Self-pay | Admitting: Cardiology

## 2016-04-04 NOTE — Telephone Encounter (Signed)
See results note. 

## 2016-04-04 NOTE — Telephone Encounter (Signed)
New Message  Pt voiced returning your call.  Please f/u

## 2016-05-19 ENCOUNTER — Other Ambulatory Visit: Payer: Self-pay | Admitting: *Deleted

## 2016-05-19 MED ORDER — AMLODIPINE BESYLATE 5 MG PO TABS: 5.0000 mg | ORAL_TABLET | Freq: Every day | ORAL | 2 refills | Status: DC

## 2016-11-14 ENCOUNTER — Ambulatory Visit (INDEPENDENT_AMBULATORY_CARE_PROVIDER_SITE_OTHER): Payer: BLUE CROSS/BLUE SHIELD

## 2016-11-14 ENCOUNTER — Encounter: Payer: Self-pay | Admitting: Family Medicine

## 2016-11-14 ENCOUNTER — Ambulatory Visit (INDEPENDENT_AMBULATORY_CARE_PROVIDER_SITE_OTHER): Payer: BLUE CROSS/BLUE SHIELD | Admitting: Family Medicine

## 2016-11-14 VITALS — BP 110/76 | HR 73 | Temp 98.1°F | Resp 18 | Ht 72.0 in | Wt 168.4 lb

## 2016-11-14 DIAGNOSIS — M1611 Unilateral primary osteoarthritis, right hip: Secondary | ICD-10-CM | POA: Diagnosis not present

## 2016-11-14 DIAGNOSIS — M25551 Pain in right hip: Secondary | ICD-10-CM

## 2016-11-14 DIAGNOSIS — M533 Sacrococcygeal disorders, not elsewhere classified: Secondary | ICD-10-CM

## 2016-11-14 NOTE — Progress Notes (Signed)
Subjective:  By signing my name below, I, Essence Howell, attest that this documentation has been prepared under the direction and in the presence of Wendie Agreste, MD Electronically Signed: Ladene Artist, ED Scribe 11/14/2016 at 8:31 AM.   Patient ID: Kyle Roberts, male    DOB: 1955/09/21, 61 y.o.   MRN: [id number]  Chief Complaint  Patient presents with  . Hip Pain    x over year ago mostly just the right hip and pt states it feels like it wants to give out    HPI Kyle Roberts is a 61 y.o. male who presents to Primary Care at [facility] complaining of intermittent, severe R hip/buttock pain over a year. No known fall or injury. Pt states that the pain occurs rarely but has become more frequent over the past few months and is aggravating. He reports increased pain with sitting, twisting and rotating hips while playing golf. Pt states that R hip feels as if it wants to give out but that resolves after 30-40 seconds. Pt also reports doing crunches with knees flexed on a padded mat, but denies pain with this exercise. No treatments tried PTA. Denies pain with sleeping and ambulating, night sweats, fever, weight loss, radiating pain into LE, weakness in LE, bladder/bowel incontinence, saddle paresthesia.  Patient Active Problem List   Diagnosis Date Noted  . Elevated LDL cholesterol level 01/28/2016  . BPH without obstruction/lower urinary tract symptoms 09/11/2015  . Benign neoplasm of sigmoid colon 08/18/2015  . Diverticulosis of large intestine without perforation or abscess without bleeding 08/18/2015  . Ischemic heart disease   . Hypertension   . Hypercholesteremia    Past Medical History:  Diagnosis Date  . Hypercholesteremia   . Hypertension   . Ischemic heart disease    Past Surgical History:  Procedure Laterality Date  . CORONARY ANGIOPLASTY  01/31/2003  . US ECHOCARDIOGRAPHY  05/27/09   No Known Allergies Prior to Admission medications   Medication Sig Start Date End  Date Taking? Authorizing Provider  amLODipine (NORVASC) 5 MG tablet Take 1 tablet (5 mg total) by mouth daily. 05/19/16   Dorothy Spark, MD  aspirin 81 MG chewable tablet Chew 81 mg by mouth daily.     [provider]  rosuvastatin (CRESTOR) 10 MG tablet Take 1 tablet (10 mg total) by mouth daily. 01/28/16 04/27/16  Dorothy Spark, MD   Social History   Social History  . Marital status: Married    Spouse name: N/A  . Number of children: N/A  . Years of education: N/A   Occupational History  . Not on file.   Social History Main Topics  . Smoking status: Former Smoker    Types: Cigarettes  . Smokeless tobacco: Never Used  . Alcohol use Not on file  . Drug use: Unknown  . Sexual activity: Not on file   Other Topics Concern  . Not on file   Social History Narrative  . No narrative on file   Review of Systems  Constitutional: Negative for diaphoresis, fever and unexpected weight change.  Genitourinary: Negative for enuresis.  Musculoskeletal: Positive for arthralgias.  Neurological: Negative for weakness and numbness.      Objective:   Physical Exam  Constitutional: He is oriented to person, place, and time. He appears well-developed and well-nourished. No distress.  HENT:  Head: Normocephalic and atraumatic.  Eyes: Conjunctivae and EOM are normal.  Neck: Neck supple. No tracheal deviation present.  Cardiovascular: Normal rate.  Pulmonary/Chest: Effort normal. No respiratory distress.  Musculoskeletal: Normal range of motion.  Full ROM of lumbar spine. No focal tenderness. Able to heel and toe walk without difficulty. Negative seated straight leg raise. R hip exam: flexion and extension pain free. Pain free internal and external at rotation. Full strength pain free testing of strength at R hip. ROM of R hip appears to be equal to L.  Neurological: He is alert and oriented to person, place, and time. He displays no Babinski's sign on the right side. He  displays no Babinski's sign on the left side.  Reflex Scores:      Patellar reflexes are 2+ on the right side and 2+ on the left side.      Achilles reflexes are 2+ on the right side and 2+ on the left side. Skin: Skin is warm and dry.  Psychiatric: He has a normal mood and affect. His behavior is normal.  Nursing note and vitals reviewed.  Vitals:   11/14/16 0826  BP: 110/76  Pulse: 73  Resp: 18  Temp: 98.1 F (36.7 C)  TempSrc: Oral  SpO2: 98%  Weight: 168 lb 6.4 oz (76.4 kg)  Height: 6' (1.829 m)   Dg Hip Unilat W Or W/o Pelvis 2-3 Views Right  Result Date: 11/14/2016 CLINICAL DATA:  Right hip pain and sacral pain.  No known injury. EXAM: DG HIP (WITH OR WITHOUT PELVIS) 2-3V RIGHT COMPARISON:  None. FINDINGS: There are small cystic degenerative changes at the right hip. Small marginal osteophytes on the inferior rim of the right acetabulum. Minimal degenerative changes of the left hip. The pelvic bones appear otherwise normal. IMPRESSION: Slight arthritic changes of the hips, right more than left. Electronically Signed   By: Lorriane Shire M.D.   On: 11/14/2016 09:05      Assessment & Plan:   Kyle Roberts is a 61 y.o. male Right hip pain - Plan: DG HIP UNILAT W OR W/O PELVIS 2-3 VIEWS RIGHT  Sacral pain - Plan: DG HIP UNILAT W OR W/O PELVIS 2-3 VIEWS RIGHT  Intermittent symptoms, some degenerative changes seen of the hips right greater than left on x-ray. Overall reassuring exam at present. Intermittent deep low back symptoms may be from strain with crunching exercises.  -Episodic Tylenol, Advil if needed for rare use, over-the-counter glucosamine/chondroitin, and consider orthopedic eval if not improving in the next month or 2.  -Avoidance of crunch with hip flexion. Discussed abdominal engagement throughout the day, as well as planks (technique demonstrated) for safer core exercises with less shearing force across lumbar spine.  -rtc precautions.   No orders of the  defined types were placed in this encounter.  Patient Instructions   R hip symptoms may be due to some arthritis at that hip. Can try glucosamine/chondroitin over-the-counter, Tylenol as needed, but if more symptomatic would recommend discussion with orthopedic surgeon for possible injection versus physical therapy. For low back symptoms, would recommend avoiding crunch with hip flexion at the same time, then follow up if those symptoms persist.   Return to the clinic or go to the nearest emergency room if any of your symptoms worsen or new symptoms occur.   IF you received an x-ray today, you will receive an invoice from [facility]. Please contact [facility] at [phone] with questions or concerns regarding your invoice.   IF you received labwork today, you will receive an invoice from [facility]. Please contact [facility] at [phone] with questions or concerns regarding your invoice.  Our billing staff will not be able to assist you with questions regarding bills from these companies.  You will be contacted with the lab results as soon as they are available. The fastest way to get your results is to activate your My Chart account. Instructions are located on the last page of this paperwork. If you have not heard from [place] regarding the results in 2 weeks, please contact this office.       I personally performed the services described in this documentation, which was scribed in my presence. The recorded information has been reviewed and considered for accuracy and completeness, addended by me as needed, and agree with information above.  Signed,   [name], MD [facility] at [facility].  11/14/16 9:12 AM

## 2016-11-14 NOTE — Patient Instructions (Addendum)
R hip symptoms may be due to some arthritis at that hip. Can try glucosamine/chondroitin over-the-counter, Tylenol as needed, but if more symptomatic would recommend discussion with orthopedic surgeon for possible injection versus physical therapy. For low back symptoms, would recommend avoiding crunch with hip flexion at the same time, then follow up if those symptoms persist.   Return to the clinic or go to the nearest emergency room if any of your symptoms worsen or new symptoms occur.   IF you received an x-ray today, you will receive an invoice from [facility]. Please contact [facility] at [phone] with questions or concerns regarding your invoice.   IF you received labwork today, you will receive an invoice from [facility]. Please contact [facility] at [phone] with questions or concerns regarding your invoice.   Our billing staff will not be able to assist you with questions regarding bills from these companies.  You will be contacted with the lab results as soon as they are available. The fastest way to get your results is to activate your My Chart account. Instructions are located on the last page of this paperwork. If you have not heard from [place] regarding the results in 2 weeks, please contact this office.

## 2016-12-15 ENCOUNTER — Telehealth: Payer: Self-pay | Admitting: *Deleted

## 2016-12-15 DIAGNOSIS — E7800 Pure hypercholesterolemia, unspecified: Secondary | ICD-10-CM

## 2016-12-15 NOTE — Telephone Encounter (Signed)
Pt calling in to ask Dr Meda Coffee if he can get his routine labs done on the same day as he see's her on 03/24/17.  Pt usually gets a cmet, cbc w diff, tsh, lipids, and hemoglobin A1C done.  Confirmed with Dr Meda Coffee if this is ok to order these labs, same day as she see's him on 03/24/17.  Per Dr Meda Coffee, she agreed to this.  Scheduled the pt a lab appt for 03/24/17 to check a cmet, tsh, cbc w diff, lipids, and hemoglobin A1C.  Advised the pt to come fasting to this lab appt.  Pt verbalized understanding and agrees with this plan.

## 2017-03-06 ENCOUNTER — Other Ambulatory Visit: Payer: Self-pay | Admitting: Cardiology

## 2017-03-24 ENCOUNTER — Encounter (INDEPENDENT_AMBULATORY_CARE_PROVIDER_SITE_OTHER): Payer: Self-pay

## 2017-03-24 ENCOUNTER — Other Ambulatory Visit: Payer: BLUE CROSS/BLUE SHIELD

## 2017-03-24 ENCOUNTER — Ambulatory Visit: Payer: BLUE CROSS/BLUE SHIELD | Admitting: Cardiology

## 2017-03-24 ENCOUNTER — Encounter: Payer: Self-pay | Admitting: Cardiology

## 2017-03-24 VITALS — BP 130/92 | HR 73 | Ht 72.0 in | Wt 169.0 lb

## 2017-03-24 DIAGNOSIS — E7849 Other hyperlipidemia: Secondary | ICD-10-CM | POA: Diagnosis not present

## 2017-03-24 DIAGNOSIS — I2510 Atherosclerotic heart disease of native coronary artery without angina pectoris: Secondary | ICD-10-CM | POA: Diagnosis not present

## 2017-03-24 DIAGNOSIS — I119 Hypertensive heart disease without heart failure: Secondary | ICD-10-CM

## 2017-03-24 LAB — CBC WITH DIFFERENTIAL/PLATELET
Basophils Absolute: 0 10*3/uL (ref 0.0–0.2)
Basos: 0 %
EOS (ABSOLUTE): 0.4 10*3/uL (ref 0.0–0.4)
Eos: 4 %
Hematocrit: 45 % (ref 37.5–51.0)
Hemoglobin: 15.9 g/dL (ref 13.0–17.7)
Immature Grans (Abs): 0 10*3/uL (ref 0.0–0.1)
Immature Granulocytes: 0 %
Lymphocytes Absolute: 2.1 10*3/uL (ref 0.7–3.1)
Lymphs: 20 %
MCH: 31.7 pg (ref 26.6–33.0)
MCHC: 35.3 g/dL (ref 31.5–35.7)
MCV: 90 fL (ref 79–97)
Monocytes Absolute: 0.8 10*3/uL (ref 0.1–0.9)
Monocytes: 7 %
Neutrophils Absolute: 7.1 10*3/uL — ABNORMAL HIGH (ref 1.4–7.0)
Neutrophils: 69 %
Platelets: 197 10*3/uL (ref 150–379)
RBC: 5.01 x10E6/uL (ref 4.14–5.80)
RDW: 12.6 % (ref 12.3–15.4)
WBC: 10.4 10*3/uL (ref 3.4–10.8)

## 2017-03-24 LAB — COMPREHENSIVE METABOLIC PANEL
ALT: 20 IU/L (ref 0–44)
AST: 22 IU/L (ref 0–40)
Albumin/Globulin Ratio: 1.5 (ref 1.2–2.2)
Albumin: 4.4 g/dL (ref 3.6–4.8)
Alkaline Phosphatase: 57 IU/L (ref 39–117)
BUN/Creatinine Ratio: 18 (ref 10–24)
BUN: 15 mg/dL (ref 8–27)
Bilirubin Total: 0.5 mg/dL (ref 0.0–1.2)
CO2: 26 mmol/L (ref 20–29)
Calcium: 9.4 mg/dL (ref 8.6–10.2)
Chloride: 103 mmol/L (ref 96–106)
Creatinine, Ser: 0.82 mg/dL (ref 0.76–1.27)
GFR calc Af Amer: 109 mL/min/{1.73_m2} (ref >59)
GFR calc non Af Amer: 94 mL/min/{1.73_m2} (ref >59)
Globulin, Total: 3 g/dL (ref 1.5–4.5)
Glucose: 129 mg/dL — ABNORMAL HIGH (ref 65–99)
Potassium: 4 mmol/L (ref 3.5–5.2)
Sodium: 142 mmol/L (ref 134–144)
Total Protein: 7.4 g/dL (ref 6.0–8.5)

## 2017-03-24 LAB — LIPID PANEL
Chol/HDL Ratio: 3.5 ratio (ref 0.0–5.0)
Cholesterol, Total: 150 mg/dL (ref 100–199)
HDL: 43 mg/dL (ref >39)
LDL Calculated: 94 mg/dL (ref 0–99)
Triglycerides: 65 mg/dL (ref 0–149)
VLDL Cholesterol Cal: 13 mg/dL (ref 5–40)

## 2017-03-24 LAB — TSH: TSH: 1.59 u[IU]/mL (ref 0.450–4.500)

## 2017-03-24 MED ORDER — ROSUVASTATIN CALCIUM 10 MG PO TABS: ORAL_TABLET | ORAL | 3 refills | Status: DC

## 2017-03-24 MED ORDER — AMLODIPINE BESYLATE 5 MG PO TABS: 5.0000 mg | ORAL_TABLET | Freq: Every day | ORAL | 3 refills | Status: DC

## 2017-03-24 NOTE — Progress Notes (Signed)
Cardiology Office Note    Date:  03/24/2017   ID:  DAMONTAE Roberts, DOB 01-20-1955, MRN [id number]  PCP:  Darlyne Russian, MD  Cardiologist:  Dr Mare Ferrari --> Ena Dawley, MD   History of Present Illness:  Kyle Roberts is a 62 y.o. male seen for a one-year followup office visit. He has a past history of known ischemic heart disease. He underwent stenting of an intermediate branch on 01/31/03. He had a nuclear stress test 01/02/08 showing no ischemia and normal LV function. He has been doing great. He continues to work and is very active and walks 3 times a week with his wife about a mile. He has no chest pain shortness of breath no dyspnea on exertion no palpitation or claudication or lower extremity edema. The only change to his regimen is that he stop taking Lipitor mostly for potential side effects that he hasn't experienced and started squid oil instead. His diet is healthy.  03/24/2017 - one-year follow-up, he continues to walk daily and feels well, continues to work at a [employer]. Denies any chest pain, shortness of breath no claudication palpitations or lower extremity edema. His only concern right now is inguinal hernia that was previously repaired but it's associated with pain again. He has been compliant with his medications and is tolerating them well.  Past Medical History:  Diagnosis Date  . Hypercholesteremia   . Hypertension   . Ischemic heart disease    Past Surgical History:  Procedure Laterality Date  . CORONARY ANGIOPLASTY  01/31/2003  . US ECHOCARDIOGRAPHY  05/27/09   Current Medications: Outpatient Medications Prior to Visit  Medication Sig Dispense Refill  . aspirin 81 MG chewable tablet Chew 81 mg by mouth daily.     . finasteride (PROSCAR) 5 MG tablet Take 5 mg by mouth daily.  11  . amLODipine (NORVASC) 5 MG tablet Take 1 tablet (5 mg total) by mouth daily. 90 tablet 2  . rosuvastatin (CRESTOR) 10 MG tablet TAKE 1 TABLET(10 MG) BY MOUTH DAILY 90 tablet  0   No facility-administered medications prior to visit.      Allergies:   Patient has no known allergies.   Social History   Socioeconomic History  . Marital status: Married    Spouse name: None  . Number of children: None  . Years of education: None  . Highest education level: None  Social Needs  . Financial resource strain: None  . Food insecurity - worry: None  . Food insecurity - inability: None  . Transportation needs - medical: None  . Transportation needs - non-medical: None  Occupational History  . None  Tobacco Use  . Smoking status: Former Smoker    Types: Cigarettes  . Smokeless tobacco: Never Used  Substance and Sexual Activity  . Alcohol use: None  . Drug use: None  . Sexual activity: None  Other Topics Concern  . None  Social History Narrative  . None     Family History:  The patient's family history includes Heart attack in his brother and father.   ROS:   Please see the history of present illness.    ROS All other systems reviewed and are negative.   PHYSICAL EXAM:   VS:  BP (!) 130/92 (BP Location: Left Arm, Patient Position: Sitting, Cuff Size: Normal)   Pulse 73   Ht 6' (1.829 m)   Wt 169 lb (76.7 kg)   SpO2 98%   BMI 22.92 kg/m  GEN: Well nourished, well developed, in no acute distress  HEENT: normal  Neck: no JVD, carotid bruits, or masses Cardiac: RRR; no murmurs, rubs, or gallops,no edema  Respiratory:  clear to auscultation bilaterally, normal work of breathing GI: soft, nontender, nondistended, + BS MS: no deformity or atrophy  Skin: warm and dry, no rash Neuro:  Alert and Oriented x 3, Strength and sensation are intact Psych: euthymic mood, full affect  Wt Readings from Last 3 Encounters:  03/24/17 169 lb (76.7 kg)  11/14/16 168 lb 6.4 oz (76.4 kg)  01/27/16 166 lb (75.3 kg)    Studies/Labs Reviewed:   EKG:  EKG is ordered today.  The ekg ordered today demonstrates SR, normal ECG, unchanged from prior.  Recent  Labs: 03/30/2016: ALT 36; BUN 16; Creatinine, Ser 0.87; Potassium 4.0; Sodium 139   Lipid Panel    Component Value Date/Time   CHOL 168 03/30/2016 0737   TRIG 62 03/30/2016 0737   HDL 44 03/30/2016 0737   CHOLHDL 3.8 03/30/2016 0737   CHOLHDL 4.2 11/28/2014 0905   VLDL 16 11/28/2014 0905   LDLCALC 112 (H) 03/30/2016 0737   LDLDIRECT 189.8 02/08/2011 0853   EKG performed today 03/24/2017 shows normal sinus rhythm, normal EKG, unchanged from prior. This is personally reviewed.   ASSESSMENT:    1. Hypertensive heart disease without heart failure   2. Other hyperlipidemia   3. Coronary artery disease involving native coronary artery of native heart without angina pectoris    PLAN:  In order of problems listed above:  1. He is asymptomatic from CAD standpoint 15 years post his stent, his EKG is completely normal and unchanged from prior. 2. His blood pressures well controlled on current regimen. 3. Hyperlipidemia - we will recheck lipids today, refill rosuvastatin that he is tolerating well..  We will check CMP, lipids, CBC, TSH, hemoglobin A1c today. Follow-up in one year.   Medication Adjustments/Labs and Tests Ordered: Current medicines are reviewed at length with the patient today.  Concerns regarding medicines are outlined above.  Medication changes, Labs and Tests ordered today are listed in the Patient Instructions below. Patient Instructions  Medication Instructions:   Your physician recommends that you continue on your current medications as directed. Please refer to the Current Medication list given to you today.   Labwork:  TODAY--CMET, CBC W DIFF, TSH, AND LIPIDS     Follow-Up:  Your physician wants you to follow-up in: [place] will receive a reminder letter in the mail two months in advance. If you don't receive a letter, please call our office to schedule the follow-up appointment.        If you need a refill on your cardiac  medications before your next appointment, please call your pharmacy.      Signed, Ena Dawley, MD  03/24/2017 8:28 AM    [address], [place], [place]  [address] Phone: [phone]; Fax: [phone]

## 2017-03-24 NOTE — Patient Instructions (Signed)
Medication Instructions:   Your physician recommends that you continue on your current medications as directed. Please refer to the Current Medication list given to you today.   Labwork:  TODAY--CMET, CBC W DIFF, TSH, AND LIPIDS     Follow-Up:  Your physician wants you to follow-up in: [place] will receive a reminder letter in the mail two months in advance. If you don't receive a letter, please call our office to schedule the follow-up appointment.        If you need a refill on your cardiac medications before your next appointment, please call your pharmacy.

## 2017-04-05 DIAGNOSIS — N400 Enlarged prostate without lower urinary tract symptoms: Secondary | ICD-10-CM | POA: Diagnosis not present

## 2017-04-05 DIAGNOSIS — K4021 Bilateral inguinal hernia, without obstruction or gangrene, recurrent: Secondary | ICD-10-CM | POA: Diagnosis not present

## 2017-04-05 DIAGNOSIS — R351 Nocturia: Secondary | ICD-10-CM | POA: Diagnosis not present

## 2017-04-05 DIAGNOSIS — R972 Elevated prostate specific antigen [PSA]: Secondary | ICD-10-CM | POA: Diagnosis not present

## 2017-04-11 ENCOUNTER — Ambulatory Visit: Payer: BLUE CROSS/BLUE SHIELD | Admitting: Family Medicine

## 2017-04-21 DIAGNOSIS — K4020 Bilateral inguinal hernia, without obstruction or gangrene, not specified as recurrent: Secondary | ICD-10-CM | POA: Diagnosis not present

## 2017-04-21 DIAGNOSIS — K4091 Unilateral inguinal hernia, without obstruction or gangrene, recurrent: Secondary | ICD-10-CM | POA: Diagnosis not present

## 2017-05-29 DIAGNOSIS — K4021 Bilateral inguinal hernia, without obstruction or gangrene, recurrent: Secondary | ICD-10-CM | POA: Diagnosis not present

## 2017-05-29 DIAGNOSIS — K4091 Unilateral inguinal hernia, without obstruction or gangrene, recurrent: Secondary | ICD-10-CM | POA: Diagnosis not present

## 2017-05-29 DIAGNOSIS — K4090 Unilateral inguinal hernia, without obstruction or gangrene, not specified as recurrent: Secondary | ICD-10-CM | POA: Diagnosis not present

## 2017-06-09 ENCOUNTER — Other Ambulatory Visit: Payer: Self-pay | Admitting: Cardiology

## 2017-07-22 ENCOUNTER — Other Ambulatory Visit: Payer: Self-pay | Admitting: Cardiology

## 2017-07-24 NOTE — Telephone Encounter (Signed)
Outpatient Medication Detail    Disp Refills Start End   amLODipine (NORVASC) 5 MG tablet 90 tablet 3 03/24/2017    Sig - Route: Take 1 tablet (5 mg total) by mouth daily. - Oral   Sent to pharmacy as: amLODipine (NORVASC) 5 MG tablet   E-Prescribing Status: Receipt confirmed by pharmacy (03/24/2017 8:27 AM EST)   Associated Diagnoses   Hypertensive heart disease without heart failure - Primary     Other hyperlipidemia     Coronary artery disease involving native coronary artery of native heart without angina pectoris     Pharmacy   [id number] - [place], [place]

## 2018-02-05 ENCOUNTER — Telehealth: Payer: Self-pay | Admitting: Cardiology

## 2018-02-05 DIAGNOSIS — I259 Chronic ischemic heart disease, unspecified: Secondary | ICD-10-CM

## 2018-02-05 DIAGNOSIS — I10 Essential (primary) hypertension: Secondary | ICD-10-CM

## 2018-02-05 DIAGNOSIS — E7800 Pure hypercholesterolemia, unspecified: Secondary | ICD-10-CM

## 2018-02-05 NOTE — Telephone Encounter (Signed)
Verbal orders given by Dr Meda Coffee to order labs on this mutual pt, for the same day as he see's Kyle Henri PA-C for 03/26/2018. Lab orders are for a CMET, CBC W DIFF, TSH, and LIPIDS were placed for this pt.  Left a detailed message on the pts confirmed VM that the lab orders were placed, as he requested, and he will need to come fasting to that lab appt, meaning nothing to eat or drink after midnight, the night before, except for he may have water with his meds.  Advised the pt on his confirmed VM to call the office back with any questions or concerns regarding this message.

## 2018-02-05 NOTE — Telephone Encounter (Signed)
New Message:     Pt has an appt with Ellen Henri on 03-26-18, Dr Meda Coffee is booked out until July. Pt would like for you to order his lab work for 03-26-18 please.

## 2018-03-07 ENCOUNTER — Encounter: Payer: Self-pay | Admitting: Cardiology

## 2018-03-26 ENCOUNTER — Other Ambulatory Visit: Payer: BLUE CROSS/BLUE SHIELD

## 2018-03-26 ENCOUNTER — Ambulatory Visit: Payer: BLUE CROSS/BLUE SHIELD | Admitting: Cardiology

## 2018-03-27 ENCOUNTER — Encounter: Payer: Self-pay | Admitting: Cardiology

## 2018-03-27 ENCOUNTER — Other Ambulatory Visit: Payer: Medicare Other

## 2018-03-27 ENCOUNTER — Ambulatory Visit (INDEPENDENT_AMBULATORY_CARE_PROVIDER_SITE_OTHER): Payer: Medicare Other | Admitting: Cardiology

## 2018-03-27 VITALS — BP 124/76 | HR 72 | Ht 72.0 in | Wt 174.8 lb

## 2018-03-27 DIAGNOSIS — I2510 Atherosclerotic heart disease of native coronary artery without angina pectoris: Secondary | ICD-10-CM | POA: Diagnosis not present

## 2018-03-27 DIAGNOSIS — I10 Essential (primary) hypertension: Secondary | ICD-10-CM

## 2018-03-27 DIAGNOSIS — I259 Chronic ischemic heart disease, unspecified: Secondary | ICD-10-CM

## 2018-03-27 DIAGNOSIS — E782 Mixed hyperlipidemia: Secondary | ICD-10-CM | POA: Diagnosis not present

## 2018-03-27 DIAGNOSIS — E7800 Pure hypercholesterolemia, unspecified: Secondary | ICD-10-CM

## 2018-03-27 LAB — CBC WITH DIFFERENTIAL/PLATELET
Basophils Absolute: 0 10*3/uL (ref 0.0–0.2)
Basos: 0 %
EOS (ABSOLUTE): 0.2 10*3/uL (ref 0.0–0.4)
Eos: 3 %
Hematocrit: 47.2 % (ref 37.5–51.0)
Hemoglobin: 16.1 g/dL (ref 13.0–17.7)
Immature Grans (Abs): 0 10*3/uL (ref 0.0–0.1)
Immature Granulocytes: 0 %
Lymphocytes Absolute: 2.1 10*3/uL (ref 0.7–3.1)
Lymphs: 23 %
MCH: 30.8 pg (ref 26.6–33.0)
MCHC: 34.1 g/dL (ref 31.5–35.7)
MCV: 90 fL (ref 79–97)
Monocytes Absolute: 0.7 10*3/uL (ref 0.1–0.9)
Monocytes: 8 %
Neutrophils Absolute: 5.8 10*3/uL (ref 1.4–7.0)
Neutrophils: 66 %
Platelets: 178 10*3/uL (ref 150–450)
RBC: 5.22 x10E6/uL (ref 4.14–5.80)
RDW: 12.2 % (ref 11.6–15.4)
WBC: 8.9 10*3/uL (ref 3.4–10.8)

## 2018-03-27 LAB — LIPID PANEL
Chol/HDL Ratio: 3.9 ratio (ref 0.0–5.0)
Cholesterol, Total: 182 mg/dL (ref 100–199)
HDL: 47 mg/dL (ref >39)
LDL Calculated: 120 mg/dL — ABNORMAL HIGH (ref 0–99)
Triglycerides: 73 mg/dL (ref 0–149)
VLDL Cholesterol Cal: 15 mg/dL (ref 5–40)

## 2018-03-27 LAB — COMPREHENSIVE METABOLIC PANEL
ALT: 28 IU/L (ref 0–44)
AST: 28 IU/L (ref 0–40)
Albumin/Globulin Ratio: 1.6 (ref 1.2–2.2)
Albumin: 4.4 g/dL (ref 3.8–4.8)
Alkaline Phosphatase: 52 IU/L (ref 39–117)
BUN/Creatinine Ratio: 16 (ref 10–24)
BUN: 14 mg/dL (ref 8–27)
Bilirubin Total: 0.7 mg/dL (ref 0.0–1.2)
CO2: 24 mmol/L (ref 20–29)
Calcium: 9.6 mg/dL (ref 8.6–10.2)
Chloride: 102 mmol/L (ref 96–106)
Creatinine, Ser: 0.85 mg/dL (ref 0.76–1.27)
GFR calc Af Amer: 106 mL/min/{1.73_m2} (ref >59)
GFR calc non Af Amer: 92 mL/min/{1.73_m2} (ref >59)
Globulin, Total: 2.7 g/dL (ref 1.5–4.5)
Glucose: 126 mg/dL — ABNORMAL HIGH (ref 65–99)
Potassium: 4.9 mmol/L (ref 3.5–5.2)
Sodium: 140 mmol/L (ref 134–144)
Total Protein: 7.1 g/dL (ref 6.0–8.5)

## 2018-03-27 LAB — TSH: TSH: 1.96 u[IU]/mL (ref 0.450–4.500)

## 2018-03-27 NOTE — Progress Notes (Signed)
She said that that was what she had  03/27/2018 Kyle Roberts   Feb 15, 1955  [id number]  Primary Physician Wendie Agreste, MD Primary Cardiologist: Dr. Meda Coffee  Electrophysiologist: None   Reason for Visit/CC: Yearly follow-up for CAD/ischemic heart disease  HPI:  Kyle Roberts is a 63 y.o. male who presents to clinic today for his yearly follow-up.  He is followed by Dr. Meda Coffee. Former pt of Dr. Mare Ferrari.  He has a history of CAD/ischemic heart disease, hypertension and hyperlipidemia.  He underwent stenting of an intermediate branch on 01/31/03. He had a nuclear stress test 01/02/08 showing no ischemia and normal LV function.  At his last office visit in 2019 he was doing well without any cardiac complaints.  He is here today with his wife.  She also is followed by Dr. Meda Coffee. Pt reports that they have done well from a symptom standpoint since their last office visit. They deny chest pain and dyspnea. No exertional symptoms w/ exercise or ADLs. Pt also denies orthopnea, PND, LEE, palpitations, dizziness, syncope/ near syncope and claudication.   They report full medication compliance. No intolerances or medication side effects. No complaints w/ Crestor. Pt also denies any hospitalizations,  ED/urgent care visits or surgeries since their last office visit.  He tries to stay active.  He walks for exercise.  Also plays golf.  No exertional symptoms.  Blood pressure is well controlled at 124/76.  EKG shows normal sinus rhythm.  No ischemic abnormalities.  68 bpm.  He is due for repeat lipid panel.  He is fasting today.    No outpatient medications have been marked as taking for the 03/27/18 encounter (Office Visit) with Kyle Pandy, PA-C.   No Known Allergies Past Medical History:  Diagnosis Date  . Hypercholesteremia   . Hypertension   . Ischemic heart disease    Family History  Problem Relation Age of Onset  . Heart attack Father   . Heart attack Brother    Past  Surgical History:  Procedure Laterality Date  . CORONARY ANGIOPLASTY  01/31/2003  . US ECHOCARDIOGRAPHY  05/27/09   Social History   Socioeconomic History  . Marital status: Married    Spouse name: Not on file  . Number of children: Not on file  . Years of education: Not on file  . Highest education level: Not on file  Occupational History  . Not on file  Social Needs  . Financial resource strain: Not on file  . Food insecurity:    Worry: Not on file    Inability: Not on file  . Transportation needs:    Medical: Not on file    Non-medical: Not on file  Tobacco Use  . Smoking status: Former Smoker    Types: Cigarettes  . Smokeless tobacco: Never Used  Substance and Sexual Activity  . Alcohol use: Never    Frequency: Never  . Drug use: Never  . Sexual activity: Not on file  Lifestyle  . Physical activity:    Days per week: Not on file    Minutes per session: Not on file  . Stress: Not on file  Relationships  . Social connections:    Talks on phone: Not on file    Gets together: Not on file    Attends religious service: Not on file    Active member of club or organization: Not on file    Attends meetings of clubs or organizations: Not on file    Relationship status:  Not on file  . Intimate partner violence:    Fear of current or ex partner: Not on file    Emotionally abused: Not on file    Physically abused: Not on file    Forced sexual activity: Not on file  Other Topics Concern  . Not on file  Social History Narrative  . Not on file     Lipid Panel     Component Value Date/Time   CHOL 150 03/24/2017 0833   TRIG 65 03/24/2017 0833   HDL 43 03/24/2017 0833   CHOLHDL 3.5 03/24/2017 0833   CHOLHDL 4.2 11/28/2014 0905   VLDL 16 11/28/2014 0905   LDLCALC 94 03/24/2017 0833   LDLDIRECT 189.8 02/08/2011 0853    Review of Systems: General: negative for chills, fever, night sweats or weight changes.  Cardiovascular: negative for chest pain, dyspnea on  exertion, edema, orthopnea, palpitations, paroxysmal nocturnal dyspnea or shortness of breath Dermatological: negative for rash Respiratory: negative for cough or wheezing Urologic: negative for hematuria Abdominal: negative for nausea, vomiting, diarrhea, bright red blood per rectum, melena, or hematemesis Neurologic: negative for visual changes, syncope, or dizziness All other systems reviewed and are otherwise negative except as noted above.   Physical Exam:  Blood pressure 124/76, pulse 72, height 6' (1.829 m), weight 174 lb 12.8 oz (79.3 kg), SpO2 97 %.  General appearance: alert, cooperative and no distress Neck: no carotid bruit and no JVD Lungs: clear to auscultation bilaterally Heart: regular rate and rhythm, S1, S2 normal, no murmur, click, rub or gallop Extremities: extremities normal, atraumatic, no cyanosis or edema Pulses: 2+ and symmetric Skin: Skin color, texture, turgor normal. No rashes or lesions Neurologic: Grossly normal  EKG normal sinus rhythm.  No ischemic abnormalities.  68 bpm.-- personally reviewed   ASSESSMENT AND PLAN:   1.  CAD: He underwent stenting of an intermediate branch on 01/31/03. He had a nuclear stress test 01/02/08 showing no ischemia and normal LV function.  He denies any anginal symptomatology.  No exertional chest pain or dyspnea with physical activity or ADLs.  EKG shows normal sinus rhythm.  No ischemic abnormalities.  He is on aspirin and statin therapy.  Blood pressures well controlled.  He is not on a beta-blocker due to baseline resting heart rate in the 60s.  2.  Hypertension: Well-controlled with amlodipine, 124/76 today.  Continue amlodipine 5 mg daily.  3.  Hyperlipidemia: On statin therapy with Crestor.  He reports full compliance without any side effects.  He is due for repeat fasting lipid panel today.  He is fasting.  Will obtain.  LDL goal in the setting of CAD is less than 70 mg/dL.  Will adjust statin dose if  needed.   Follow-Up in 1 year with Dr. Margarita Rana, [facility] 03/27/2018 8:51 AM

## 2018-03-29 ENCOUNTER — Telehealth: Payer: Self-pay | Admitting: Cardiology

## 2018-03-29 NOTE — Telephone Encounter (Signed)
Dorothy Spark, MD  Nuala Alpha, LPN        Please ask if he is taking rosuvastatin and if yes, please refer to the lipid clinic   Associated Results   Result Notes for Comp Met (CMET)   Notes recorded by Nuala Alpha, LPN on 3/75/0510 at 7:12 PM EDT Spoke with the pt and informed him of his lab results and recommendations per Dr Meda Coffee. Confirmed with the pt that he is taking his rosuvastatin as prescribed, and he reports he is taking this with no skipped doses.  Pt states that he has been a little slack with his eating and exercise regimen, and plans to eat better and walk more, with the warmer weather.  Informed the pt that per Dr Meda Coffee she recommends that we refer him to our lipid clinic for further management of his lipids.  Pt states he will hold off on being referred to our lipid clinic at this time, and will be more conservative with managing this problem, by eating better and exercising daily. Informed the pt that I will make Dr Meda Coffee aware of this decision, and follow-up as needed.

## 2018-03-29 NOTE — Telephone Encounter (Signed)
New message ° ° °Patient is returning call for lab results. °

## 2018-04-04 DIAGNOSIS — Z125 Encounter for screening for malignant neoplasm of prostate: Secondary | ICD-10-CM | POA: Diagnosis not present

## 2018-04-04 DIAGNOSIS — R972 Elevated prostate specific antigen [PSA]: Secondary | ICD-10-CM | POA: Diagnosis not present

## 2018-04-04 DIAGNOSIS — N5201 Erectile dysfunction due to arterial insufficiency: Secondary | ICD-10-CM | POA: Diagnosis not present

## 2018-04-04 DIAGNOSIS — N401 Enlarged prostate with lower urinary tract symptoms: Secondary | ICD-10-CM | POA: Diagnosis not present

## 2018-04-04 DIAGNOSIS — R351 Nocturia: Secondary | ICD-10-CM | POA: Diagnosis not present

## 2018-04-11 ENCOUNTER — Ambulatory Visit: Payer: BLUE CROSS/BLUE SHIELD | Admitting: Cardiology

## 2018-04-23 ENCOUNTER — Other Ambulatory Visit: Payer: Self-pay | Admitting: Cardiology

## 2018-09-01 ENCOUNTER — Other Ambulatory Visit: Payer: Self-pay | Admitting: Cardiology

## 2018-09-01 DIAGNOSIS — I2510 Atherosclerotic heart disease of native coronary artery without angina pectoris: Secondary | ICD-10-CM

## 2018-09-01 DIAGNOSIS — I119 Hypertensive heart disease without heart failure: Secondary | ICD-10-CM

## 2018-09-01 DIAGNOSIS — E7849 Other hyperlipidemia: Secondary | ICD-10-CM

## 2018-11-15 ENCOUNTER — Other Ambulatory Visit: Payer: Self-pay | Admitting: Cardiology

## 2018-11-15 MED ORDER — ROSUVASTATIN CALCIUM 10 MG PO TABS: ORAL_TABLET | ORAL | 1 refills | Status: DC

## 2019-02-14 ENCOUNTER — Telehealth: Payer: Self-pay | Admitting: Cardiology

## 2019-02-14 DIAGNOSIS — E7800 Pure hypercholesterolemia, unspecified: Secondary | ICD-10-CM

## 2019-02-14 DIAGNOSIS — I2510 Atherosclerotic heart disease of native coronary artery without angina pectoris: Secondary | ICD-10-CM

## 2019-02-14 DIAGNOSIS — E782 Mixed hyperlipidemia: Secondary | ICD-10-CM

## 2019-02-14 NOTE — Telephone Encounter (Signed)
Kyle Roberts, the pt will be coming to see you in clinic on 04/02/19, and is inquiring if you would be willing to order his yearly labs to be done, prior to seeing you.   Pt is aware that I will need to obtain lab orders from you, before scheduling this appt prior to his OV.  Are you ok with him having labs done, and if so which one's would you like me to order?  His wife will also be seeing you same day. Please advise.

## 2019-02-14 NOTE — Telephone Encounter (Signed)
Patient requesting lab orders to be put in before his 04/02/19 appt.

## 2019-02-15 NOTE — Telephone Encounter (Signed)
Spoke with the Kyle Roberts and informed him that Kyle Roberts okayed for him to have labs prior to his visit with her on 04/02/19.  Kyle Roberts states he would now like to just have them same day as his visit, for he will be seeing Kyle Roberts at [facility] on 3/16, and he can make one commute.  Lab appt scheduled for the Kyle Roberts on 04/02/19, same day as he see's Kyle Roberts.  We will check CMET, CBC, LIPIDS, AND TSH.  Kyle Roberts is aware to come fasting to this lab appt.  Informed the Kyle Roberts he can just go to the lab after he has his visit with Kyle Roberts.  Kyle Roberts verbalized understanding and agrees with this plan.

## 2019-02-15 NOTE — Telephone Encounter (Signed)
Yes he can have labs before the visit. Plan FLP CMET CBC and TSH. Thanks

## 2019-03-27 NOTE — Progress Notes (Signed)
Cardiology Office Note    Date:  04/02/2019   ID:  Kyle Roberts, DOB May 14, 1955, MRN [id number]  PCP:  Wendie Agreste, MD  Cardiologist: Ena Dawley, MD EPS: None  Chief Complaint  Patient presents with  . Follow-up    History of Present Illness:  Kyle Roberts is a 64 y.o. male with history of CAD status post stenting of intermediate branch 01/31/2003 NST 12/2007 no ischemia normal LV function, hypertension, hyperlipidemia.  Last seen in our office 03/27/2018 was doing well.  No beta-blocker because of baseline heart rate in the 60s.  Denies chest pain, palpitations, dyspnea, dyspnea on exertion, dizziness or presyncope. Walk 1-1 1/2 four-six days/week.  Received his Covid vaccine already.    Past Medical History:  Diagnosis Date  . Hypercholesteremia   . Hypertension   . Ischemic heart disease     Past Surgical History:  Procedure Laterality Date  . CORONARY ANGIOPLASTY  01/31/2003  . US ECHOCARDIOGRAPHY  05/27/09    Current Medications: Current Meds  Medication Sig  . amLODipine (NORVASC) 5 MG tablet Take 1 tablet (5 mg total) by mouth daily.  . rosuvastatin (CRESTOR) 10 MG tablet TAKE 1 TABLET(10 MG) BY MOUTH DAILY  . [DISCONTINUED] amLODipine (NORVASC) 5 MG tablet TAKE 1 TABLET BY MOUTH EVERY DAY  . [DISCONTINUED] rosuvastatin (CRESTOR) 10 MG tablet TAKE 1 TABLET(10 MG) BY MOUTH DAILY     Allergies:   Patient has no known allergies.   Social History   Socioeconomic History  . Marital status: Married    Spouse name: Not on file  . Number of children: Not on file  . Years of education: Not on file  . Highest education level: Not on file  Occupational History  . Not on file  Tobacco Use  . Smoking status: Former Smoker    Types: Cigarettes  . Smokeless tobacco: Never Used  Substance and Sexual Activity  . Alcohol use: Never  . Drug use: Never  . Sexual activity: Not on file  Other Topics Concern  . Not on file  Social History Narrative    . Not on file   Social Determinants of Health   Financial Resource Strain:   . Difficulty of Paying Living Expenses:   Food Insecurity:   . Worried About Charity fundraiser in the Last Year:   . Arboriculturist in the Last Year:   Transportation Needs:   . Film/video editor (Medical):   Marland Kitchen Lack of Transportation (Non-Medical):   Physical Activity:   . Days of Exercise per Week:   . Minutes of Exercise per Session:   Stress:   . Feeling of Stress :   Social Connections:   . Frequency of Communication with Friends and Family:   . Frequency of Social Gatherings with Friends and Family:   . Attends Religious Services:   . Active Member of Clubs or Organizations:   . Attends Archivist Meetings:   Marland Kitchen Marital Status:      Family History:  The patient's   family history includes Heart attack in his brother and father.   ROS:   Please see the history of present illness.    ROS All other systems reviewed and are negative.   PHYSICAL EXAM:   VS:  BP 138/72   Pulse 65   Ht 6' (1.829 m)   Wt 170 lb (77.1 kg)   SpO2 97%   BMI 23.06 kg/m   Physical Exam  GEN: Well nourished, well developed, in no acute distress  Neck: no JVD, carotid bruits, or masses Cardiac:RRR; no murmurs, rubs, or gallops  Respiratory:  clear to auscultation bilaterally, normal work of breathing GI: soft, nontender, nondistended, + BS Ext: without cyanosis, clubbing, or edema, Good distal pulses bilaterally Neuro:  Alert and Oriented x 3 Psych: euthymic mood, full affect  Wt Readings from Last 3 Encounters:  04/02/19 170 lb (77.1 kg)  03/27/18 174 lb 12.8 oz (79.3 kg)  03/24/17 169 lb (76.7 kg)      Studies/Labs Reviewed:   EKG:  EKG is ordered today.    Recent Labs: No results found for requested labs within last 8760 hours.   Lipid Panel    Component Value Date/Time   CHOL 182 03/27/2018 0823   TRIG 73 03/27/2018 0823   HDL 47 03/27/2018 0823   CHOLHDL 3.9 03/27/2018 0823    CHOLHDL 4.2 11/28/2014 0905   VLDL 16 11/28/2014 0905   LDLCALC 120 (H) 03/27/2018 0823   LDLDIRECT 189.8 02/08/2011 0853    Additional studies/ records that were reviewed today include:  2D echo 2011 normal LV function mild LVH mild MR    ASSESSMENT:    1. Coronary artery disease involving native coronary artery of native heart without angina pectoris   2. Essential hypertension   3. Hyperlipidemia, unspecified hyperlipidemia type   4. Hypertensive heart disease without heart failure   5. Other hyperlipidemia      PLAN:  In order of problems listed above:  CAD status post stenting of intermediate branch 01/21/2003, normal NST 12/2007 on aspirin and statin.  No beta-blocker because of heart rates in the 60s-no angina. Declines ekg this year  Essential hypertension BP stable  Hyperlipidemia on low dose crestor.  LDL 120 last year.  Will check labs today.    Medication Adjustments/Labs and Tests Ordered: Current medicines are reviewed at length with the patient today.  Concerns regarding medicines are outlined above.  Medication changes, Labs and Tests ordered today are listed in the Patient Instructions below. Patient Instructions  Medication Instructions:  Your physician recommends that you continue on your current medications as directed. Please refer to the Current Medication list given to you today.  *If you need a refill on your cardiac medications before your next appointment, please call your pharmacy*   Lab Work: Already ordered  If you have labs (blood work) drawn today and your tests are completely normal, you will receive your results only by: Marland Kitchen MyChart Message (if you have MyChart) OR . A paper copy in the mail If you have any lab test that is abnormal or we need to change your treatment, we will call you to review the results.   Testing/Procedures: None ordered   Follow-Up: At [facility], you and your health needs are our priority.  As part of  our continuing mission to provide you with exceptional heart care, we have created designated Provider Care Teams.  These Care Teams include your primary Cardiologist (physician) and Advanced Practice Providers (APPs -  Physician Assistants and Nurse Practitioners) who all work together to provide you with the care you need, when you need it.  We recommend signing up for the patient portal called "MyChart".  Sign up information is provided on this After Visit Summary.  MyChart is used to connect with patients for Virtual Visits (Telemedicine).  Patients are able to view lab/test results, encounter notes, upcoming appointments, etc.  Non-urgent messages can be sent to your  provider as well.   To learn more about what you can do with MyChart, go to [email or web address].    Your next appointment:   12 month(s)  The format for your next appointment:   In Person  Provider:   You may see Ena Dawley, MD or one of the following Advanced Practice Providers on your designated Care Team:    Melina Copa, PA-C  [name], PA-C    Other Instructions      Signed, [name], PA-C  04/02/2019 8:29 AM    [address], [place], [place]  [address] Phone: [phone]; Fax: [phone]

## 2019-04-02 ENCOUNTER — Other Ambulatory Visit: Payer: Self-pay

## 2019-04-02 ENCOUNTER — Ambulatory Visit (INDEPENDENT_AMBULATORY_CARE_PROVIDER_SITE_OTHER): Payer: Medicare Other | Admitting: Physician Assistant

## 2019-04-02 ENCOUNTER — Other Ambulatory Visit: Payer: Medicare Other

## 2019-04-02 ENCOUNTER — Encounter: Payer: Self-pay | Admitting: Physician Assistant

## 2019-04-02 VITALS — BP 138/72 | HR 65 | Ht 72.0 in | Wt 170.0 lb

## 2019-04-02 DIAGNOSIS — I119 Hypertensive heart disease without heart failure: Secondary | ICD-10-CM

## 2019-04-02 DIAGNOSIS — E7849 Other hyperlipidemia: Secondary | ICD-10-CM

## 2019-04-02 DIAGNOSIS — I2510 Atherosclerotic heart disease of native coronary artery without angina pectoris: Secondary | ICD-10-CM | POA: Diagnosis not present

## 2019-04-02 DIAGNOSIS — I10 Essential (primary) hypertension: Secondary | ICD-10-CM

## 2019-04-02 DIAGNOSIS — E782 Mixed hyperlipidemia: Secondary | ICD-10-CM | POA: Diagnosis not present

## 2019-04-02 DIAGNOSIS — E785 Hyperlipidemia, unspecified: Secondary | ICD-10-CM | POA: Diagnosis not present

## 2019-04-02 DIAGNOSIS — E7800 Pure hypercholesterolemia, unspecified: Secondary | ICD-10-CM

## 2019-04-02 LAB — COMPREHENSIVE METABOLIC PANEL
ALT: 22 IU/L (ref 0–44)
AST: 32 IU/L (ref 0–40)
Albumin/Globulin Ratio: 1.7 (ref 1.2–2.2)
Albumin: 4.6 g/dL (ref 3.8–4.8)
Alkaline Phosphatase: 55 IU/L (ref 39–117)
BUN/Creatinine Ratio: 19 (ref 10–24)
BUN: 18 mg/dL (ref 8–27)
Bilirubin Total: 0.8 mg/dL (ref 0.0–1.2)
CO2: 26 mmol/L (ref 20–29)
Calcium: 9.7 mg/dL (ref 8.6–10.2)
Chloride: 103 mmol/L (ref 96–106)
Creatinine, Ser: 0.95 mg/dL (ref 0.76–1.27)
GFR calc Af Amer: 96 mL/min/{1.73_m2} (ref >59)
GFR calc non Af Amer: 83 mL/min/{1.73_m2} (ref >59)
Globulin, Total: 2.7 g/dL (ref 1.5–4.5)
Glucose: 123 mg/dL — ABNORMAL HIGH (ref 65–99)
Potassium: 5.2 mmol/L (ref 3.5–5.2)
Sodium: 141 mmol/L (ref 134–144)
Total Protein: 7.3 g/dL (ref 6.0–8.5)

## 2019-04-02 LAB — CBC
Hematocrit: 46.9 % (ref 37.5–51.0)
Hemoglobin: 16.4 g/dL (ref 13.0–17.7)
MCH: 31.1 pg (ref 26.6–33.0)
MCHC: 35 g/dL (ref 31.5–35.7)
MCV: 89 fL (ref 79–97)
Platelets: 171 10*3/uL (ref 150–450)
RBC: 5.27 x10E6/uL (ref 4.14–5.80)
RDW: 11.8 % (ref 11.6–15.4)
WBC: 9.7 10*3/uL (ref 3.4–10.8)

## 2019-04-02 LAB — LIPID PANEL
Chol/HDL Ratio: 3.3 ratio (ref 0.0–5.0)
Cholesterol, Total: 163 mg/dL (ref 100–199)
HDL: 49 mg/dL (ref >39)
LDL Chol Calc (NIH): 103 mg/dL — ABNORMAL HIGH (ref 0–99)
Triglycerides: 55 mg/dL (ref 0–149)
VLDL Cholesterol Cal: 11 mg/dL (ref 5–40)

## 2019-04-02 LAB — TSH: TSH: 1.66 u[IU]/mL (ref 0.450–4.500)

## 2019-04-02 MED ORDER — ROSUVASTATIN CALCIUM 10 MG PO TABS: ORAL_TABLET | ORAL | 3 refills | Status: DC

## 2019-04-02 MED ORDER — AMLODIPINE BESYLATE 5 MG PO TABS: 5.0000 mg | ORAL_TABLET | Freq: Every day | ORAL | 3 refills | Status: DC

## 2019-04-02 NOTE — Patient Instructions (Signed)
Medication Instructions:  Your physician recommends that you continue on your current medications as directed. Please refer to the Current Medication list given to you today.  *If you need a refill on your cardiac medications before your next appointment, please call your pharmacy*   Lab Work: Already ordered  If you have labs (blood work) drawn today and your tests are completely normal, you will receive your results only by: Marland Kitchen MyChart Message (if you have MyChart) OR . A paper copy in the mail If you have any lab test that is abnormal or we need to change your treatment, we will call you to review the results.   Testing/Procedures: None ordered   Follow-Up: At [facility], you and your health needs are our priority.  As part of our continuing mission to provide you with exceptional heart care, we have created designated Provider Care Teams.  These Care Teams include your primary Cardiologist (physician) and Advanced Practice Providers (APPs -  Physician Assistants and Nurse Practitioners) who all work together to provide you with the care you need, when you need it.  We recommend signing up for the patient portal called "MyChart".  Sign up information is provided on this After Visit Summary.  MyChart is used to connect with patients for Virtual Visits (Telemedicine).  Patients are able to view lab/test results, encounter notes, upcoming appointments, etc.  Non-urgent messages can be sent to your provider as well.   To learn more about what you can do with MyChart, go to [email or web address].    Your next appointment:   12 month(s)  The format for your next appointment:   In Person  Provider:   You may see Ena Dawley, MD or one of the following Advanced Practice Providers on your designated Care Team:    Melina Copa, PA-C  [name], PA-C    Other Instructions

## 2019-04-03 ENCOUNTER — Other Ambulatory Visit: Payer: Self-pay

## 2019-04-03 DIAGNOSIS — E782 Mixed hyperlipidemia: Secondary | ICD-10-CM

## 2019-04-03 MED ORDER — ROSUVASTATIN CALCIUM 20 MG PO TABS: 20.0000 mg | ORAL_TABLET | Freq: Every day | ORAL | 3 refills | Status: DC

## 2020-03-03 ENCOUNTER — Telehealth: Payer: Self-pay | Admitting: Physician Assistant

## 2020-03-03 NOTE — Telephone Encounter (Signed)
I spoke with the pt and him and his wife have appts with Dr. Johney Frame 04/03/20 and they are not wanting to come ahead of time for labs as they may have done in the past... I advised that is okay and we will have them see Dr. Johney Frame first and she will order any labs they needed but since their appts are first thing in the morning they will both be fasting.

## 2020-03-03 NOTE — Telephone Encounter (Signed)
    Pt said he needs to get blood work on the same day his appt. The order on file is expired.

## 2020-03-27 ENCOUNTER — Ambulatory Visit: Payer: Medicare Other | Admitting: Cardiology

## 2020-03-31 NOTE — Progress Notes (Addendum)
Cardiology Office Note:    Date:  04/03/2020   ID:  Kyle Roberts, DOB 1955/01/24, MRN [id number]  PCP:  Kyle Agreste, MD   [place]  Cardiologist:  Kyle Dawley, MD  Advanced Practice Provider:  No care team member to display Electrophysiologist:  None    Referring MD: Kyle Agreste, MD    History of Present Illness:    Kyle Roberts is a 65 y.o. male with a hx of CAD s/p DES to ramus 01/31/2003, HTN and HLD who was previously followed by Dr. Meda Roberts who now returns to clinic for follow-up.  Last saw Kyle Roberts in 04-21-19 where he was doing well. He was walking without symptoms.  Patient states that he is doing well. He has been walking regularly daily with no exertional symptoms. No shortness of breath, LE edema, lightheadedness, dizziness. Blood pressure is well controlled at home running 110-120s. Has been compliant with all medications. Follows a healthy diet rich in fruits, vegetable and lean proteins.  Past Medical History:  Diagnosis Date  . Hypercholesteremia   . Hypertension   . Ischemic heart disease     Past Surgical History:  Procedure Laterality Date  . CORONARY ANGIOPLASTY  01/31/2003  . US ECHOCARDIOGRAPHY  05/27/09    Current Medications: Current Meds  Medication Sig  . amLODipine (NORVASC) 5 MG tablet Take 1 tablet (5 mg total) by mouth daily.  Marland Kitchen aspirin EC 81 MG tablet Take 81 mg by mouth daily. Swallow whole.  . rosuvastatin (CRESTOR) 20 MG tablet Take 1 tablet (20 mg total) by mouth daily.     Allergies:   Patient has no known allergies.   Social History   Socioeconomic History  . Marital status: Married    Spouse name: Not on file  . Number of children: Not on file  . Years of education: Not on file  . Highest education level: Not on file  Occupational History  . Not on file  Tobacco Use  . Smoking status: Former Smoker    Types: Cigarettes  . Smokeless tobacco: Never Used  Vaping Use  .  Vaping Use: Never used  Substance and Sexual Activity  . Alcohol use: Never  . Drug use: Never  . Sexual activity: Not on file  Other Topics Concern  . Not on file  Social History Narrative  . Not on file   Social Determinants of Health   Financial Resource Strain: Not on file  Food Insecurity: Not on file  Transportation Needs: Not on file  Physical Activity: Not on file  Stress: Not on file  Social Connections: Not on file     Family History: The patient's family history includes Heart attack in his brother and father.  ROS:   Please see the history of present illness.    Review of Systems  Constitutional: Negative for chills, diaphoresis and fever.  HENT: Negative for hearing loss and sore throat.   Eyes: Negative for blurred vision and redness.  Respiratory: Negative for shortness of breath.   Cardiovascular: Negative for chest pain, palpitations, orthopnea, claudication, leg swelling and PND.  Gastrointestinal: Negative for melena, nausea and vomiting.  Genitourinary: Negative for dysuria and flank pain.  Musculoskeletal: Positive for joint pain.  Neurological: Negative for dizziness and loss of consciousness.  Endo/Heme/Allergies: Negative for polydipsia.  Psychiatric/Behavioral: Negative for substance abuse.    EKGs/Labs/Other Studies Reviewed:    The following studies were reviewed today: TTE 04/20/2009: normal LV function mild  LVH mild MR  EKG:  EKG declined today  Recent Labs: No results found for requested labs within last 8760 hours.  Recent Lipid Panel    Component Value Date/Time   CHOL 163 04/02/2019 0850   TRIG 55 04/02/2019 0850   HDL 49 04/02/2019 0850   CHOLHDL 3.3 04/02/2019 0850   CHOLHDL 4.2 11/28/2014 0905   VLDL 16 11/28/2014 0905   LDLCALC 103 (H) 04/02/2019 0850   LDLDIRECT 189.8 02/08/2011 0853     Physical Exam:    VS:  BP 122/80   Pulse 67   Ht 6' (1.829 m)   Wt 168 lb 3.2 oz (76.3 kg)   SpO2 97%   BMI 22.81 kg/m     Wt  Readings from Last 3 Encounters:  04/03/20 168 lb 3.2 oz (76.3 kg)  04/02/19 170 lb (77.1 kg)  03/27/18 174 lb 12.8 oz (79.3 kg)     GEN:  Well nourished, well developed in no acute distress HEENT: Normal NECK: No JVD; No carotid bruits CARDIAC: RRR, no murmurs, rubs, gallops RESPIRATORY:  Clear to auscultation without rales, wheezing or rhonchi  ABDOMEN: Soft, non-tender, non-distended MUSCULOSKELETAL:  No edema; No deformity  SKIN: Warm and dry NEUROLOGIC:  Alert and oriented x 3 PSYCHIATRIC:  Normal affect   ASSESSMENT:    1. Coronary artery disease involving native coronary artery of native heart without angina pectoris   2. Mixed hyperlipidemia   3. Essential hypertension    PLAN:    In order of problems listed above:  #CAD s/p PCI to ramus 01/2003: Doing well with no recurrence of angina. Active without exertional symptoms. Taking all medications as prescribed.  -Continue ASA 81mg  daily -Continue crestor 20mg  daily -No BB due to bradycardia  #HTN: Well controlled today -Continue amlodipine 5mg  daily  #HLD: -Continue crestor 20mg  daily -Repeat lipids today with goal LDL<70     Medication Adjustments/Labs and Tests Ordered: Current medicines are reviewed at length with the patient today.  Concerns regarding medicines are outlined above.  Orders Placed This Encounter  Procedures  . Lipid panel  . Basic metabolic panel   No orders of the defined types were placed in this encounter.   Patient Instructions  Medication Instructions:  Your physician recommends that you continue on your current medications as directed. Please refer to the Current Medication list given to you today. *If you need a refill on your cardiac medications before your next appointment, please call your pharmacy*   Lab Work: Bmet, Lipids If you have labs (blood work) drawn today and your tests are completely normal, you will receive your results only by: Marland Kitchen MyChart Message (if you have  MyChart) OR . A paper copy in the mail If you have any lab test that is abnormal or we need to change your treatment, we will call you to review the results.     Follow-Up: At [facility], you and your health needs are our priority.  As part of our continuing mission to provide you with exceptional heart care, we have created designated Provider Care Teams.  These Care Teams include your primary Cardiologist (physician) and Advanced Practice Providers (APPs -  Physician Assistants and Nurse Practitioners) who all work together to provide you with the care you need, when you need it.   Your next appointment:   12 month(s)  The format for your next appointment:   In Person  Provider:   You may see Dr. Gwyndolyn Kaufman or one of the following  Advanced Practice Providers on your designated Care Team:    Richardson Dopp, PA-C  Robbie Lis, [place]         Signed, Freada Bergeron, MD  04/03/2020 8:41 AM    [place]

## 2020-04-03 ENCOUNTER — Ambulatory Visit (INDEPENDENT_AMBULATORY_CARE_PROVIDER_SITE_OTHER): Payer: Medicare Other | Admitting: Cardiology

## 2020-04-03 ENCOUNTER — Encounter: Payer: Self-pay | Admitting: Cardiology

## 2020-04-03 ENCOUNTER — Other Ambulatory Visit: Payer: Self-pay

## 2020-04-03 VITALS — BP 122/80 | HR 67 | Ht 72.0 in | Wt 168.2 lb

## 2020-04-03 DIAGNOSIS — I10 Essential (primary) hypertension: Secondary | ICD-10-CM

## 2020-04-03 DIAGNOSIS — I2510 Atherosclerotic heart disease of native coronary artery without angina pectoris: Secondary | ICD-10-CM | POA: Diagnosis not present

## 2020-04-03 DIAGNOSIS — E782 Mixed hyperlipidemia: Secondary | ICD-10-CM | POA: Diagnosis not present

## 2020-04-03 LAB — BASIC METABOLIC PANEL
BUN/Creatinine Ratio: 22 (ref 10–24)
BUN: 20 mg/dL (ref 8–27)
CO2: 24 mmol/L (ref 20–29)
Calcium: 9.5 mg/dL (ref 8.6–10.2)
Chloride: 103 mmol/L (ref 96–106)
Creatinine, Ser: 0.89 mg/dL (ref 0.76–1.27)
Glucose: 127 mg/dL — ABNORMAL HIGH (ref 65–99)
Potassium: 4.3 mmol/L (ref 3.5–5.2)
Sodium: 141 mmol/L (ref 134–144)
eGFR: 94 mL/min/{1.73_m2} (ref >59)

## 2020-04-03 LAB — LIPID PANEL
Chol/HDL Ratio: 3.4 ratio (ref 0.0–5.0)
Cholesterol, Total: 164 mg/dL (ref 100–199)
HDL: 48 mg/dL (ref >39)
LDL Chol Calc (NIH): 106 mg/dL — ABNORMAL HIGH (ref 0–99)
Triglycerides: 47 mg/dL (ref 0–149)
VLDL Cholesterol Cal: 10 mg/dL (ref 5–40)

## 2020-04-03 NOTE — Patient Instructions (Signed)
Medication Instructions:  Your physician recommends that you continue on your current medications as directed. Please refer to the Current Medication list given to you today. *If you need a refill on your cardiac medications before your next appointment, please call your pharmacy*   Lab Work: Bmet, Lipids If you have labs (blood work) drawn today and your tests are completely normal, you will receive your results only by: Marland Kitchen MyChart Message (if you have MyChart) OR . A paper copy in the mail If you have any lab test that is abnormal or we need to change your treatment, we will call you to review the results.     Follow-Up: At [facility], you and your health needs are our priority.  As part of our continuing mission to provide you with exceptional heart care, we have created designated Provider Care Teams.  These Care Teams include your primary Cardiologist (physician) and Advanced Practice Providers (APPs -  Physician Assistants and Nurse Practitioners) who all work together to provide you with the care you need, when you need it.   Your next appointment:   12 month(s)  The format for your next appointment:   In Person  Provider:   You may see Dr. Gwyndolyn Kaufman or one of the following Advanced Practice Providers on your designated Care Team:    Richardson Dopp, PA-C  [place], [place]

## 2020-04-20 ENCOUNTER — Other Ambulatory Visit: Payer: Self-pay | Admitting: Physician Assistant

## 2020-04-20 DIAGNOSIS — I119 Hypertensive heart disease without heart failure: Secondary | ICD-10-CM

## 2020-04-20 DIAGNOSIS — I2510 Atherosclerotic heart disease of native coronary artery without angina pectoris: Secondary | ICD-10-CM

## 2020-04-20 DIAGNOSIS — E7849 Other hyperlipidemia: Secondary | ICD-10-CM

## 2020-04-20 NOTE — Telephone Encounter (Signed)
Pt's medication was sent to pt's pharmacy as requested. Confirmation received.  °

## 2020-05-22 ENCOUNTER — Other Ambulatory Visit: Payer: Self-pay | Admitting: Physician Assistant

## 2020-08-25 DIAGNOSIS — Z23 Encounter for immunization: Secondary | ICD-10-CM | POA: Diagnosis not present

## 2020-09-04 DIAGNOSIS — R3915 Urgency of urination: Secondary | ICD-10-CM | POA: Diagnosis not present

## 2020-09-04 DIAGNOSIS — N5201 Erectile dysfunction due to arterial insufficiency: Secondary | ICD-10-CM | POA: Diagnosis not present

## 2020-09-04 DIAGNOSIS — N280 Ischemia and infarction of kidney: Secondary | ICD-10-CM | POA: Diagnosis not present

## 2020-09-15 DIAGNOSIS — Z20822 Contact with and (suspected) exposure to covid-19: Secondary | ICD-10-CM | POA: Diagnosis not present

## 2020-11-23 DIAGNOSIS — Z20822 Contact with and (suspected) exposure to covid-19: Secondary | ICD-10-CM | POA: Diagnosis not present

## 2020-12-04 ENCOUNTER — Other Ambulatory Visit: Payer: Self-pay

## 2020-12-04 DIAGNOSIS — E7849 Other hyperlipidemia: Secondary | ICD-10-CM

## 2020-12-04 DIAGNOSIS — I2510 Atherosclerotic heart disease of native coronary artery without angina pectoris: Secondary | ICD-10-CM

## 2020-12-04 DIAGNOSIS — I119 Hypertensive heart disease without heart failure: Secondary | ICD-10-CM

## 2020-12-04 MED ORDER — AMLODIPINE BESYLATE 5 MG PO TABS: 5.0000 mg | ORAL_TABLET | Freq: Every day | ORAL | 1 refills | Status: DC

## 2020-12-17 DIAGNOSIS — Z20822 Contact with and (suspected) exposure to covid-19: Secondary | ICD-10-CM | POA: Diagnosis not present

## 2021-01-07 DIAGNOSIS — H10502 Unspecified blepharoconjunctivitis, left eye: Secondary | ICD-10-CM | POA: Diagnosis not present

## 2021-03-01 ENCOUNTER — Telehealth: Payer: Self-pay | Admitting: Cardiology

## 2021-03-01 NOTE — Telephone Encounter (Signed)
Patient would like to have lab work done prior to office visit.

## 2021-03-01 NOTE — Telephone Encounter (Signed)
Pt aware may skip breakfast day of appt and have labs done the same day as appt  and pt agrees with this .Kyle Roberts

## 2021-05-04 NOTE — Progress Notes (Deleted)
?Cardiology Office Note:   ? ?Date:  05/04/2021  ? ?ID:  Kyle Roberts, DOB 1955/05/27, MRN [id number] ? ?PCP:  Wendie Agreste, MD ?  ?[place]  ?Cardiologist:  Ena Dawley, MD  ?Advanced Practice Provider:  No care team member to display ?Electrophysiologist:  None  ? ? ?Referring MD: Wendie Agreste, MD  ? ? ?History of Present Illness:   ? ?Kyle Roberts is a 66 y.o. male with a hx of CAD s/p DES to ramus 01/31/2003, HTN and HLD who was previously followed by Dr. Meda Coffee who now returns to clinic for follow-up. ? ?Was last seen in clinic on 03/2020 where he was doing very well from a CV standpoint. ? ?Today, *** ? ?Past Medical History:  ?Diagnosis Date  ? Hypercholesteremia   ? Hypertension   ? Ischemic heart disease   ? ? ?Past Surgical History:  ?Procedure Laterality Date  ? CORONARY ANGIOPLASTY  01/31/2003  ? US ECHOCARDIOGRAPHY  05/27/09  ? ? ?Current Medications: ?No outpatient medications have been marked as taking for the 05/06/21 encounter (Appointment) with Freada Bergeron, MD.  ?  ? ?Allergies:   Patient has no known allergies.  ? ?Social History  ? ?Socioeconomic History  ? Marital status: Married  ?  Spouse name: Not on file  ? Number of children: Not on file  ? Years of education: Not on file  ? Highest education level: Not on file  ?Occupational History  ? Not on file  ?Tobacco Use  ? Smoking status: Former  ?  Types: Cigarettes  ? Smokeless tobacco: Never  ?Vaping Use  ? Vaping Use: Never used  ?Substance and Sexual Activity  ? Alcohol use: Never  ? Drug use: Never  ? Sexual activity: Not on file  ?Other Topics Concern  ? Not on file  ?Social History Narrative  ? Not on file  ? ?Social Determinants of Health  ? ?Financial Resource Strain: Not on file  ?Food Insecurity: Not on file  ?Transportation Needs: Not on file  ?Physical Activity: Not on file  ?Stress: Not on file  ?Social Connections: Not on file  ?  ? ?Family History: ?The patient's family history  includes Heart attack in his brother and father. ? ?ROS:   ?Please see the history of present illness.    ?Review of Systems  ?Constitutional:  Negative for chills, diaphoresis and fever.  ?HENT:  Negative for hearing loss and sore throat.   ?Eyes:  Negative for blurred vision and redness.  ?Respiratory:  Negative for shortness of breath.   ?Cardiovascular:  Negative for chest pain, palpitations, orthopnea, claudication, leg swelling and PND.  ?Gastrointestinal:  Negative for melena, nausea and vomiting.  ?Genitourinary:  Negative for dysuria and flank pain.  ?Musculoskeletal:  Positive for joint pain.  ?Neurological:  Negative for dizziness and loss of consciousness.  ?Endo/Heme/Allergies:  Negative for polydipsia.  ?Psychiatric/Behavioral:  Negative for substance abuse.   ? ?EKGs/Labs/Other Studies Reviewed:   ? ?The following studies were reviewed today: ?TTE 2011: normal LV function mild LVH mild MR ? ?EKG:  EKG declined today ? ?Recent Labs: ?No results found for requested labs within last 8760 hours.  ?Recent Lipid Panel ?   ?Component Value Date/Time  ? CHOL 164 04/03/2020 0830  ? TRIG 47 04/03/2020 0830  ? HDL 48 04/03/2020 0830  ? CHOLHDL 3.4 04/03/2020 0830  ? CHOLHDL 4.2 11/28/2014 0905  ? VLDL 16 11/28/2014 0905  ? LDLCALC 106 (H)  04/03/2020 0830  ? LDLDIRECT 189.8 02/08/2011 0853  ? ? ? ?Physical Exam:   ? ?VS:  There were no vitals taken for this visit.   ? ?Wt Readings from Last 3 Encounters:  ?04/03/20 168 lb 3.2 oz (76.3 kg)  ?04/02/19 170 lb (77.1 kg)  ?03/27/18 174 lb 12.8 oz (79.3 kg)  ?  ? ?GEN:  Well nourished, well developed in no acute distress ?HEENT: Normal ?NECK: No JVD; No carotid bruits ?CARDIAC: RRR, no murmurs, rubs, gallops ?RESPIRATORY:  Clear to auscultation without rales, wheezing or rhonchi  ?ABDOMEN: Soft, non-tender, non-distended ?MUSCULOSKELETAL:  No edema; No deformity  ?SKIN: Warm and dry ?NEUROLOGIC:  Alert and oriented x 3 ?PSYCHIATRIC:  Normal affect  ? ?ASSESSMENT:    ? ?No diagnosis found. ? ?PLAN:   ? ?In order of problems listed above: ? ?#CAD s/p PCI to ramus 01/2003: ?Doing well with no recurrence of angina. Active without exertional symptoms. Taking all medications as prescribed.  ?-Continue ASA '81mg'$  daily ?-Continue crestor '20mg'$  daily ?-No BB due to bradycardia ? ?#HTN: ?Well controlled today ?-Continue amlodipine '5mg'$  daily ? ?#HLD: ?-Continue crestor '20mg'$  daily ? ? ?  ?Medication Adjustments/Labs and Tests Ordered: ?Current medicines are reviewed at length with the patient today.  Concerns regarding medicines are outlined above.  ?No orders of the defined types were placed in this encounter. ? ?No orders of the defined types were placed in this encounter. ? ? ?There are no Patient Instructions on file for this visit. ?  ? ?Signed, ?Freada Bergeron, MD  ?05/04/2021 7:41 PM    ?[place] ?

## 2021-05-06 ENCOUNTER — Encounter: Payer: Self-pay | Admitting: Cardiology

## 2021-05-06 ENCOUNTER — Ambulatory Visit: Payer: Medicare PPO | Admitting: Cardiology

## 2021-05-06 VITALS — BP 120/78 | HR 76 | Ht 72.0 in | Wt 159.0 lb

## 2021-05-06 DIAGNOSIS — I10 Essential (primary) hypertension: Secondary | ICD-10-CM

## 2021-05-06 DIAGNOSIS — E782 Mixed hyperlipidemia: Secondary | ICD-10-CM

## 2021-05-06 DIAGNOSIS — I2510 Atherosclerotic heart disease of native coronary artery without angina pectoris: Secondary | ICD-10-CM

## 2021-05-06 LAB — BASIC METABOLIC PANEL
BUN/Creatinine Ratio: 22 (ref 10–24)
BUN: 19 mg/dL (ref 8–27)
CO2: 25 mmol/L (ref 20–29)
Calcium: 9.4 mg/dL (ref 8.6–10.2)
Chloride: 105 mmol/L (ref 96–106)
Creatinine, Ser: 0.86 mg/dL (ref 0.76–1.27)
Glucose: 105 mg/dL — ABNORMAL HIGH (ref 70–99)
Potassium: 4.9 mmol/L (ref 3.5–5.2)
Sodium: 142 mmol/L (ref 134–144)
eGFR: 94 mL/min/{1.73_m2} (ref >59)

## 2021-05-06 LAB — CBC WITH DIFFERENTIAL/PLATELET
Basophils Absolute: 0 10*3/uL (ref 0.0–0.2)
Basos: 0 %
EOS (ABSOLUTE): 0.2 10*3/uL (ref 0.0–0.4)
Eos: 3 %
Hematocrit: 44.7 % (ref 37.5–51.0)
Hemoglobin: 15.5 g/dL (ref 13.0–17.7)
Immature Grans (Abs): 0 10*3/uL (ref 0.0–0.1)
Immature Granulocytes: 0 %
Lymphocytes Absolute: 2.1 10*3/uL (ref 0.7–3.1)
Lymphs: 30 %
MCH: 31 pg (ref 26.6–33.0)
MCHC: 34.7 g/dL (ref 31.5–35.7)
MCV: 89 fL (ref 79–97)
Monocytes Absolute: 0.6 10*3/uL (ref 0.1–0.9)
Monocytes: 8 %
Neutrophils Absolute: 4.1 10*3/uL (ref 1.4–7.0)
Neutrophils: 59 %
Platelets: 162 10*3/uL (ref 150–450)
RBC: 5 x10E6/uL (ref 4.14–5.80)
RDW: 12 % (ref 11.6–15.4)
WBC: 7.1 10*3/uL (ref 3.4–10.8)

## 2021-05-06 LAB — LIPID PANEL
Chol/HDL Ratio: 3.5 ratio (ref 0.0–5.0)
Cholesterol, Total: 159 mg/dL (ref 100–199)
HDL: 45 mg/dL (ref >39)
LDL Chol Calc (NIH): 101 mg/dL — ABNORMAL HIGH (ref 0–99)
Triglycerides: 64 mg/dL (ref 0–149)
VLDL Cholesterol Cal: 13 mg/dL (ref 5–40)

## 2021-05-06 LAB — TSH: TSH: 1.04 u[IU]/mL (ref 0.450–4.500)

## 2021-05-06 NOTE — Patient Instructions (Signed)
Medication Instructions:  ? ?Your physician recommends that you continue on your current medications as directed. Please refer to the Current Medication list given to you today. ? ?*If you need a refill on your cardiac medications before your next appointment, please call your pharmacy* ? ? ?Lab Work: ? ?TODAY--BMET, CBC W DIFF, TSH, AND LIPIDS ? ?If you have labs (blood work) drawn today and your tests are completely normal, you will receive your results only by: ?MyChart Message (if you have MyChart) OR ?A paper copy in the mail ?If you have any lab test that is abnormal or we need to change your treatment, we will call you to review the results. ? ? ? ?Follow-Up: ?At [facility], you and your health needs are our priority.  As part of our continuing mission to provide you with exceptional heart care, we have created designated Provider Care Teams.  These Care Teams include your primary Cardiologist (physician) and Advanced Practice Providers (APPs -  Physician Assistants and Nurse Practitioners) who all work together to provide you with the care you need, when you need it. ? ?We recommend signing up for the patient portal called "MyChart".  Sign up information is provided on this After Visit Summary.  MyChart is used to connect with patients for Virtual Visits (Telemedicine).  Patients are able to view lab/test results, encounter notes, upcoming appointments, etc.  Non-urgent messages can be sent to your provider as well.   ?To learn more about what you can do with MyChart, go to [email or web address].   ? ?Your next appointment:   ?1 year(s) ? ?The format for your next appointment:   ?In Person ? ?Provider:   ?DR. PEMBERTON ? ?Important Information About Sugar ? ? ? ? ? ? ?

## 2021-05-06 NOTE — Progress Notes (Signed)
?Cardiology Office Note:   ? ?Date:  05/06/2021  ? ?ID:  Kyle Roberts, DOB 05/28/1955, MRN [id number] ? ?PCP:  Wendie Agreste, MD ?  ?[place]  ?Cardiologist:  Ena Dawley, MD  ?Advanced Practice Provider:  No care team member to display ?Electrophysiologist:  None  ? ? ?Referring MD: Wendie Agreste, MD  ? ? ?History of Present Illness:   ? ?Kyle Roberts is a 66 y.o. male with a hx of CAD s/p DES to ramus 01/31/2003, HTN and HLD who was previously followed by Dr. Meda Coffee who now returns to clinic for follow-up. ? ?Was last seen in clinic on 03/2020 where he was doing very well from a CV standpoint. ? ?He is accompanied by his wife Kyle Roberts, who is also a patient of mine. They both have appointments today and wish to be seen together. Today, the patient states that he is feeling well with no major concerns. ? ?He is staying active with routine walking. Usually he averages walking 1 measured mile a day, often more throughout the day. He denies exertional symptoms. ? ?He denies any palpitations, chest pain, shortness of breath, or peripheral edema. No lightheadedness, headaches, syncope, orthopnea, or PND. ? ?Lately he remains compliant with aspirin. He notes some mild bruising on his right leg. ? ?In a few weeks he is scheduled for a routine colonoscopy. ? ?Past Medical History:  ?Diagnosis Date  ? Hypercholesteremia   ? Hypertension   ? Ischemic heart disease   ? ? ?Past Surgical History:  ?Procedure Laterality Date  ? CORONARY ANGIOPLASTY  01/31/2003  ? US ECHOCARDIOGRAPHY  05/27/09  ? ? ?Current Medications: ?Current Meds  ?Medication Sig  ? amLODipine (NORVASC) 5 MG tablet Take 1 tablet (5 mg total) by mouth daily.  ? aspirin EC 81 MG tablet Take 81 mg by mouth daily. Swallow whole.  ? rosuvastatin (CRESTOR) 20 MG tablet TAKE ONE TABLET BY MOUTH ONE TIME DAILY  ?  ? ?Allergies:   Patient has no known allergies.  ? ?Social History  ? ?Socioeconomic History  ? Marital status:  Married  ?  Spouse name: Not on file  ? Number of children: Not on file  ? Years of education: Not on file  ? Highest education level: Not on file  ?Occupational History  ? Not on file  ?Tobacco Use  ? Smoking status: Former  ?  Types: Cigarettes  ? Smokeless tobacco: Never  ?Vaping Use  ? Vaping Use: Never used  ?Substance and Sexual Activity  ? Alcohol use: Never  ? Drug use: Never  ? Sexual activity: Not on file  ?Other Topics Concern  ? Not on file  ?Social History Narrative  ? Not on file  ? ?Social Determinants of Health  ? ?Financial Resource Strain: Not on file  ?Food Insecurity: Not on file  ?Transportation Needs: Not on file  ?Physical Activity: Not on file  ?Stress: Not on file  ?Social Connections: Not on file  ?  ? ?Family History: ?The patient's family history includes Heart attack in his brother and father. ? ?ROS:   ?Please see the history of present illness.    ?Review of Systems  ?Constitutional:  Negative for chills, diaphoresis and fever.  ?HENT:  Negative for hearing loss and sore throat.   ?Eyes:  Negative for blurred vision and redness.  ?Respiratory:  Negative for shortness of breath.   ?Cardiovascular:  Negative for chest pain, palpitations, orthopnea, claudication, leg swelling  and PND.  ?Gastrointestinal:  Negative for melena, nausea and vomiting.  ?Genitourinary:  Negative for dysuria and flank pain.  ?Musculoskeletal:  Positive for joint pain.  ?Neurological:  Negative for dizziness and loss of consciousness.  ?Endo/Heme/Allergies:  Negative for polydipsia.  ?Psychiatric/Behavioral:  Negative for substance abuse.   ? ?EKGs/Labs/Other Studies Reviewed:   ? ?The following studies were reviewed today: ? ?TTE 2011: normal LV function mild LVH mild MR ? ?EKG:  EKG is personally reviewed. ?05/06/2021: EKG was not ordered. ? ? ?Recent Labs: ?No results found for requested labs within last 8760 hours.  ? ?Recent Lipid Panel ?   ?Component Value Date/Time  ? CHOL 164 04/03/2020 0830  ? TRIG 47  04/03/2020 0830  ? HDL 48 04/03/2020 0830  ? CHOLHDL 3.4 04/03/2020 0830  ? CHOLHDL 4.2 11/28/2014 0905  ? VLDL 16 11/28/2014 0905  ? LDLCALC 106 (H) 04/03/2020 0830  ? LDLDIRECT 189.8 02/08/2011 0853  ? ? ? ?Physical Exam:   ? ?VS:  BP 120/78   Pulse 76   Ht 6' (1.829 m)   Wt 159 lb (72.1 kg)   SpO2 97%   BMI 21.56 kg/m?    ? ?Wt Readings from Last 3 Encounters:  ?05/06/21 159 lb (72.1 kg)  ?04/03/20 168 lb 3.2 oz (76.3 kg)  ?04/02/19 170 lb (77.1 kg)  ?  ? ?GEN:  Well nourished, well developed in no acute distress ?HEENT: Normal ?NECK: No JVD; No carotid bruits ?CARDIAC: RRR, no murmurs, rubs, gallops ?RESPIRATORY:  Clear to auscultation without rales, wheezing or rhonchi  ?ABDOMEN: Soft, non-tender, non-distended ?MUSCULOSKELETAL:  No edema; No deformity  ?SKIN: Warm and dry ?NEUROLOGIC:  Alert and oriented x 3 ?PSYCHIATRIC:  Normal affect  ? ?ASSESSMENT:   ? ?1. Coronary artery disease involving native coronary artery of native heart without angina pectoris   ?2. Mixed hyperlipidemia   ?3. Essential hypertension   ? ? ?PLAN:   ? ?In order of problems listed above: ? ?#CAD s/p PCI to ramus 01/2003: ?Doing well with no recurrence of angina. Active without exertional symptoms. Taking all medications as prescribed.  ?-Continue ASA '81mg'$  daily ?-Continue crestor '20mg'$  daily ?-No BB due to bradycardia ? ?#HTN: ?Well controlled today ?-Continue amlodipine '5mg'$  daily ? ?#HLD: ?-Continue crestor '20mg'$  daily ?-Check lipids today ?-Declined zetia on last visit ? ?Follow-up:   1 year. ?  ?Medication Adjustments/Labs and Tests Ordered: ?Current medicines are reviewed at length with the patient today.  Concerns regarding medicines are outlined above.  ? ?No orders of the defined types were placed in this encounter. ? ?No orders of the defined types were placed in this encounter. ? ?There are no Patient Instructions on file for this visit. ? ?I,Mathew Stumpf,acting as a Education administrator for Freada Bergeron, MD.,have documented all  relevant documentation on the behalf of Freada Bergeron, MD,as directed by  Freada Bergeron, MD while in the presence of Freada Bergeron, MD. ? ?I, Freada Bergeron, MD, have reviewed all documentation for this visit. The documentation on 05/06/21 for the exam, diagnosis, procedures, and orders are all accurate and complete.   ? ?Signed, ?Freada Bergeron, MD  ?05/06/2021 9:12 AM    ?[place] ?

## 2021-05-07 ENCOUNTER — Telehealth: Payer: Self-pay | Admitting: Cardiology

## 2021-05-07 NOTE — Telephone Encounter (Signed)
The patient has been notified of the result and verbalized understanding.  All questions (if any) were answered. Nuala Alpha, LPN 3/57/8978 47:84 AM  ? ?

## 2021-05-07 NOTE — Telephone Encounter (Signed)
Patient is requesting a call to review his lab results. ?

## 2021-05-07 NOTE — Telephone Encounter (Signed)
-----   Message from Freada Bergeron, MD sent at 05/07/2021 10:31 AM EDT ----- ?Electrolytes and kidney function look great. ?

## 2021-05-17 HISTORY — PX: COLONOSCOPY: SHX174

## 2021-06-25 LAB — HM COLONOSCOPY

## 2021-07-05 ENCOUNTER — Other Ambulatory Visit: Payer: Self-pay | Admitting: Sports Medicine

## 2021-07-05 ENCOUNTER — Ambulatory Visit (HOSPITAL_COMMUNITY)
Admission: RE | Admit: 2021-07-05 | Discharge: 2021-07-05 | Disposition: A | Discharge status: Home / Self Care | Payer: Medicare PPO | Source: Ambulatory Visit | Attending: Sports Medicine | Admitting: Sports Medicine

## 2021-07-05 ENCOUNTER — Other Ambulatory Visit (HOSPITAL_COMMUNITY): Payer: Self-pay | Admitting: Sports Medicine

## 2021-07-05 DIAGNOSIS — M545 Low back pain, unspecified: Secondary | ICD-10-CM

## 2021-07-14 ENCOUNTER — Other Ambulatory Visit: Payer: Self-pay | Admitting: Neurosurgery

## 2021-07-22 ENCOUNTER — Encounter (HOSPITAL_COMMUNITY): Payer: Self-pay | Admitting: Neurosurgery

## 2021-07-23 ENCOUNTER — Encounter (HOSPITAL_COMMUNITY): Payer: Self-pay | Admitting: Neurosurgery

## 2021-07-23 ENCOUNTER — Other Ambulatory Visit: Payer: Self-pay

## 2021-07-23 NOTE — Progress Notes (Signed)
Anesthesia Chart Review: Kyle Roberts  Case: [id number] Date/Time: 07/26/21 1258   Procedure: BILATERAL L3-4 LAM/FORAM, LT L45 DISCECTOMY - 3C   Anesthesia type: General   Pre-op diagnosis: LUMBAR STENOSIS WITH NEUROGENIC CLAUDICATION   Location: [facility] OR ROOM 21 / [facility]   Surgeons: Newman Pies, MD       DISCUSSION: Patient is a 66 year old male scheduled for the above procedure.  History includes former smoker, CAD (DES Ramus 01/31/03), HTN, hypercholesterolemia, anemia, hernia (right IHR 01/09/04).   Last visit with cardiologist Dr. Johney Frame was on 05/06/21. No angina and "Active without exertional symptoms." Normal LVEF by 2011 echo. One year follow-up recommended.   He is a same day work-up, so labs and EKG on arrival as indicated. Anesthesia team to evaluate on the day of surgery.    VS:  BP Readings from Last 3 Encounters:  05/06/21 120/78  04/03/20 122/80  04/02/19 138/72   Pulse Readings from Last 3 Encounters:  05/06/21 76  04/03/20 67  04/02/19 65     PROVIDERS: Wendie Agreste, MD is PCP  Gwyndolyn Kaufman, MD is cardiologist   LABS: For day of surgery as indicated. As of 05/06/21, Cr 0.86, CBC normal, glucose 105.   IMAGES: MRI L-spine 07/05/21: IMPRESSION: 1. At L4-5 there is a broad-based disc bulge with a left paracentral disc extrusion with mass effect on the left intraspinal L5 nerve root. Moderate left foraminal stenosis. Moderate right foraminal stenosis. 2. At L3-4 there is a broad-based disc bulge with a small central disc protrusion. Moderate bilateral facet arthropathy. Severe spinal stenosis. Mild left and moderate right foraminal stenosis. 3. At L2-3 there is a mild broad-based disc bulge. Moderate bilateral facet arthropathy with ligamentum flavum infolding. Moderate spinal stenosis. 4.  No acute osseous injury of the lumbar spine.    EKG: Day of surgery as indicated. Last tracing noted is from 03/27/18.    CV: Echo  05/27/09: IMPRESSION: Mild LVH with normal systolic function and impaired relaxation. LVEF 55-60%. Normal left atrial size. No thrombus. No myxoma. Mild mitral regurgitation. Normal pulmonary artery pressure.   Cardiac cath 01/31/03: ANGIOGRAPHIC DATA:   - Left Main Coronary Artery:  The left main coronary artery is very large and fairly normal. - Left Anterior Descending Artery:  The left anterior descending artery is a fairly large size.  There are minor luminal irregularities in the LAD. - Ramus Intermedius:  The ramus intermedius branch is a very large vessel.  It has a stenosis of approximately 90-95% right at the origin of the vessel. This extends for approximately 6-8 mm.  The remainder of the ramus intermedius branch is unremarkable. - Left Circumflex Artery:  The left circumflex artery is a relatively small-to-moderate size vessel.  There are minor luminal irregularities. - Right Coronary Artery:  The right coronary artery is moderate-size and is dominant. There are minor-to-moderate irregularities in the right coronary artery.  The tightness stenosis is between 20 and 30%.  The posterior descending artery and the posterolateral segment artery are fairly normal. - VENTRICULOGRAPHIC DATA:  It revealed normal left ventricular systolic function. Ejection fraction is around 65%. - PCI: Percutaneous transluminal coronary angioplasty and stenting (3.0 x 12 mm TAXUS stent) of the ramus intermedius branch.    Past Medical History:  Diagnosis Date   Anemia    Hypercholesteremia    Hypertension    Ischemic heart disease     Past Surgical History:  Procedure Laterality Date   CORONARY ANGIOPLASTY  01/31/2003  US ECHOCARDIOGRAPHY  05/27/09    MEDICATIONS: No current facility-administered medications for this encounter.    amLODipine (NORVASC) 5 MG tablet   aspirin EC 81 MG tablet   HYDROcodone-acetaminophen (NORCO/VICODIN) 5-325 MG tablet   rosuvastatin (CRESTOR) 20 MG tablet    Tadalafil 2.5 MG TABS   tiZANidine (ZANAFLEX) 2 MG tablet  ASA instructions per surgeon.    Myra Gianotti, PA-C Surgical Short Stay/Anesthesiology [facility] Phone [phone] [facility] Phone [phone] 07/23/2021 9:28 AM

## 2021-07-23 NOTE — Anesthesia Preprocedure Evaluation (Addendum)
Anesthesia Evaluation  Patient identified by MRN, date of birth, ID band Patient awake    Reviewed: Allergy & Precautions, NPO status , Patient's Chart, lab work & pertinent test results  History of Anesthesia Complications Negative for: history of anesthetic complications  Airway Mallampati: III  TM Distance: >3 FB Neck ROM: Full    Dental  (+) Dental Advisory Given   Pulmonary neg shortness of breath, neg sleep apnea, neg COPD, neg recent URI, former smoker,    breath sounds clear to auscultation       Cardiovascular hypertension, Pt. on medications (-) angina+ CAD and + Cardiac Stents   Rhythm:Regular  Patient is a 66 year old male scheduled for the above procedure.  History includes former smoker, CAD (DES Ramus 01/31/03), HTN, hypercholesterolemia, anemia, hernia (right IHR 01/09/04).   Last visit with cardiologist Dr. Johney Frame was on 05/06/21. No angina and "Active without exertional symptoms." Normal LVEF by 2011 echo. One year follow-up recommended.   He is a same day work-up, so labs and EKG on arrival as indicated. Anesthesia team to evaluate on the day of surgery.    VS:  BP Readings from Last 3 Encounters: 05/06/21 120/78 04/03/20 122/80 04/02/19 138/72  Pulse Readings from Last 3 Encounters: 05/06/21 76 04/03/20 67 04/02/19 65    PROVIDERS: Wendie Agreste, MD is PCP  Gwyndolyn Kaufman, MD is cardiologist   LABS: For day of surgery as indicated. As of 05/06/21, Cr 0.86, CBC normal, glucose 105.   IMAGES: MRI L-spine 07/05/21: IMPRESSION: 1. At L4-5 there is a broad-based disc bulge with a left paracentral disc extrusion with mass effect on the left intraspinal L5 nerve root. Moderate left foraminal stenosis. Moderate right foraminal stenosis. 2. At L3-4 there is a broad-based disc bulge with a small central disc protrusion. Moderate bilateral facet arthropathy. Severe spinal stenosis.  Mild left and moderate right foraminal stenosis. 3. At L2-3 there is a mild broad-based disc bulge. Moderate bilateral facet arthropathy with ligamentum flavum infolding. Moderate spinal stenosis. 4. No acute osseous injury of the lumbar spine.   EKG: Day of surgery as indicated. Last tracing noted is from 03/27/18.    CV: Echo 05/27/09: IMPRESSION: Mild LVH with normal systolic function and impaired relaxation. LVEF 55-60%. Normal left atrial size. No thrombus. No myxoma. Mild mitral regurgitation. Normal pulmonary artery pressure.   Cardiac cath 01/31/03: ANGIOGRAPHIC DATA:  - Left Main Coronary Artery: The left main coronary artery is very large and fairly normal. - Left Anterior Descending Artery: The left anterior descending artery is a fairly large size. There are minor luminal irregularities in the LAD. - Ramus Intermedius: The ramus intermedius branch is a very large vessel. It has a stenosis of approximately 90-95% right at the origin of the vessel.This extends for approximately 6-8 mm. The remainder of the ramusintermedius branch is unremarkable. - Left Circumflex Artery: The left circumflex artery is a relatively small-to-moderate size vessel. There are minor luminal irregularities. - Right Coronary Artery: The right coronary artery is moderate-size and is dominant. There are minor-to-moderate irregularities in the right coronaryartery. The tightness stenosis is between 20 and 30%. The posteriordescending artery and the posterolateral segment artery are fairly normal. - VENTRICULOGRAPHIC DATA: It revealed normal left ventricular systolic function. Ejectionfraction is around 65%. - PCI: Percutaneous transluminal coronary angioplasty and stenting (3.0 x 12 mm TAXUS stent) of the ramus intermedius branch.    Neuro/Psych negative neurological ROS  negative psych ROS   GI/Hepatic negative GI ROS, Neg liver ROS,  Endo/Other  Lab Results      Component                 Value               Date                      HGBA1C                   5.9 (H)             01/27/2016             Renal/GU Lab Results      Component                Value               Date                      CREATININE               0.80                07/26/2021                Musculoskeletal   Abdominal   Peds  Hematology negative hematology ROS (+) Lab Results      Component                Value               Date                      WBC                      8.0                 07/26/2021                HGB                      15.1                07/26/2021                HCT                      43.7                07/26/2021                MCV                      90.5                07/26/2021                PLT                      160                 07/26/2021              Anesthesia Other Findings   Reproductive/Obstetrics                           Anesthesia Physical Anesthesia Plan  ASA: 2  Anesthesia Plan: General   Post-op Pain Management: Ofirmev IV (intra-op)* and Toradol IV (intra-op)*   Induction: Intravenous  PONV Risk Score and Plan: 2 and Ondansetron and Dexamethasone  Airway Management Planned: Oral ETT  Additional Equipment: None  Intra-op Plan:   Post-operative Plan: Extubation in OR  Informed Consent: I have reviewed the patients History and Physical, chart, labs and discussed the procedure including the risks, benefits and alternatives for the proposed anesthesia with the patient or authorized representative who has indicated his/her understanding and acceptance.     Dental advisory given  Plan Discussed with: CRNA  Anesthesia Plan Comments: (PAT note written 07/23/2021 by Myra Gianotti, PA-C. )       Anesthesia Quick Evaluation

## 2021-07-23 NOTE — Progress Notes (Signed)
SDW CALL  Patient was given pre-op instructions over the phone. The opportunity was given for the patient to ask questions. No further questions asked. Patient verbalized understanding of instructions given.   PCP - denies Cardiologist - Dr. Johney Frame   PPM/ICD - n/a   Chest x-ray - denies EKG - DOS Stress Test - denies ECHO - 2011 Cardiac Cath - 2005  Sleep Study - denies   Aspirin Instructions: pt. States he took last dose on 7/8  ERAS Protcol - NPO at midnight  COVID TEST- n/a   Anesthesia review: yes  Patient denies shortness of breath, fever, cough and chest pain over the phone call   All instructions explained to the patient, with a verbal understanding of the material. Patient agrees to go over the instructions while at home for a better understanding.

## 2021-07-26 ENCOUNTER — Encounter (HOSPITAL_COMMUNITY): Payer: Self-pay | Admitting: Neurosurgery

## 2021-07-26 ENCOUNTER — Ambulatory Visit (HOSPITAL_COMMUNITY)
Admission: RE | Admit: 2021-07-26 | Discharge: 2021-07-27 | Disposition: A | Discharge status: Home / Self Care | Payer: Medicare PPO | Source: Ambulatory Visit | Attending: Neurosurgery | Admitting: Neurosurgery

## 2021-07-26 ENCOUNTER — Other Ambulatory Visit: Payer: Self-pay | Admitting: Neurosurgery

## 2021-07-26 ENCOUNTER — Ambulatory Visit (HOSPITAL_COMMUNITY): Payer: Medicare PPO | Admitting: Vascular Surgery

## 2021-07-26 ENCOUNTER — Ambulatory Visit (HOSPITAL_BASED_OUTPATIENT_CLINIC_OR_DEPARTMENT_OTHER): Payer: Medicare PPO | Admitting: Vascular Surgery

## 2021-07-26 ENCOUNTER — Ambulatory Visit (HOSPITAL_COMMUNITY): Payer: Medicare PPO

## 2021-07-26 ENCOUNTER — Other Ambulatory Visit: Payer: Self-pay

## 2021-07-26 ENCOUNTER — Encounter (HOSPITAL_COMMUNITY)
Admission: RE | Disposition: A | Discharge status: Home / Self Care | Payer: Self-pay | Source: Ambulatory Visit | Attending: Neurosurgery

## 2021-07-26 DIAGNOSIS — M48062 Spinal stenosis, lumbar region with neurogenic claudication: Secondary | ICD-10-CM | POA: Diagnosis present

## 2021-07-26 DIAGNOSIS — I2510 Atherosclerotic heart disease of native coronary artery without angina pectoris: Secondary | ICD-10-CM | POA: Insufficient documentation

## 2021-07-26 DIAGNOSIS — M5116 Intervertebral disc disorders with radiculopathy, lumbar region: Secondary | ICD-10-CM

## 2021-07-26 DIAGNOSIS — Z955 Presence of coronary angioplasty implant and graft: Secondary | ICD-10-CM | POA: Diagnosis not present

## 2021-07-26 DIAGNOSIS — Z87891 Personal history of nicotine dependence: Secondary | ICD-10-CM | POA: Insufficient documentation

## 2021-07-26 DIAGNOSIS — E7800 Pure hypercholesterolemia, unspecified: Secondary | ICD-10-CM | POA: Diagnosis not present

## 2021-07-26 DIAGNOSIS — M47819 Spondylosis without myelopathy or radiculopathy, site unspecified: Secondary | ICD-10-CM | POA: Diagnosis not present

## 2021-07-26 DIAGNOSIS — I10 Essential (primary) hypertension: Secondary | ICD-10-CM | POA: Diagnosis not present

## 2021-07-26 DIAGNOSIS — Z8249 Family history of ischemic heart disease and other diseases of the circulatory system: Secondary | ICD-10-CM | POA: Insufficient documentation

## 2021-07-26 HISTORY — DX: Anemia, unspecified: D64.9

## 2021-07-26 HISTORY — PX: LUMBAR LAMINECTOMY/DECOMPRESSION MICRODISCECTOMY: SHX5026

## 2021-07-26 LAB — CBC
HCT: 43.7 % (ref 39.0–52.0)
Hemoglobin: 15.1 g/dL (ref 13.0–17.0)
MCH: 31.3 pg (ref 26.0–34.0)
MCHC: 34.6 g/dL (ref 30.0–36.0)
MCV: 90.5 fL (ref 80.0–100.0)
Platelets: 160 10*3/uL (ref 150–400)
RBC: 4.83 MIL/uL (ref 4.22–5.81)
RDW: 12.5 % (ref 11.5–15.5)
WBC: 8 10*3/uL (ref 4.0–10.5)
nRBC: 0 % (ref 0.0–0.2)

## 2021-07-26 LAB — BASIC METABOLIC PANEL
Anion gap: 7 (ref 5–15)
BUN: 16 mg/dL (ref 8–23)
CO2: 23 mmol/L (ref 22–32)
Calcium: 9.1 mg/dL (ref 8.9–10.3)
Chloride: 110 mmol/L (ref 98–111)
Creatinine, Ser: 0.8 mg/dL (ref 0.61–1.24)
GFR, Estimated: >60 mL/min (ref >60)
Glucose, Bld: 97 mg/dL (ref 70–99)
Potassium: 3.8 mmol/L (ref 3.5–5.1)
Sodium: 140 mmol/L (ref 135–145)

## 2021-07-26 LAB — SURGICAL PCR SCREEN
MRSA, PCR: NEGATIVE
Staphylococcus aureus: NEGATIVE

## 2021-07-26 SURGERY — LUMBAR LAMINECTOMY/DECOMPRESSION MICRODISCECTOMY 2 LEVELS
Anesthesia: General

## 2021-07-26 MED ORDER — MORPHINE SULFATE (PF) 4 MG/ML IV SOLN: 4.0000 mg | INTRAVENOUS | Status: DC | PRN

## 2021-07-26 MED ORDER — THROMBIN 20000 UNITS EX SOLR
CUTANEOUS | Status: AC
Start: 2021-07-26 — End: ?
  Filled 2021-07-26: qty 20000

## 2021-07-26 MED ORDER — SODIUM CHLORIDE 0.9 % IV SOLN: 250.0000 mL | INTRAVENOUS | Status: DC

## 2021-07-26 MED ORDER — THROMBIN 20000 UNITS EX SOLR
CUTANEOUS | Status: DC | PRN
  Administered 2021-07-26: 20 mL via TOPICAL

## 2021-07-26 MED ORDER — FENTANYL CITRATE (PF) 250 MCG/5ML IJ SOLN
INTRAMUSCULAR | Status: AC
  Filled 2021-07-26: qty 5

## 2021-07-26 MED ORDER — CYCLOBENZAPRINE HCL 10 MG PO TABS
10.0000 mg | ORAL_TABLET | Freq: Three times a day (TID) | ORAL | Status: DC | PRN
  Administered 2021-07-26: 10 mg via ORAL
  Filled 2021-07-26: qty 1

## 2021-07-26 MED ORDER — HYDROCODONE-ACETAMINOPHEN 5-325 MG PO TABS: 1.0000 | ORAL_TABLET | ORAL | Status: DC | PRN

## 2021-07-26 MED ORDER — ONDANSETRON HCL 4 MG/2ML IJ SOLN
INTRAMUSCULAR | Status: DC | PRN
  Administered 2021-07-26: 4 mg via INTRAVENOUS

## 2021-07-26 MED ORDER — ROCURONIUM BROMIDE 100 MG/10ML IV SOLN
INTRAVENOUS | Status: DC | PRN
  Administered 2021-07-26: 70 mg via INTRAVENOUS
  Administered 2021-07-26: 10 mg via INTRAVENOUS

## 2021-07-26 MED ORDER — SUGAMMADEX SODIUM 200 MG/2ML IV SOLN
INTRAVENOUS | Status: DC | PRN
  Administered 2021-07-26: 200 mg via INTRAVENOUS

## 2021-07-26 MED ORDER — PHENOL 1.4 % MT LIQD: 1.0000 | OROMUCOSAL | Status: DC | PRN

## 2021-07-26 MED ORDER — BACITRACIN ZINC 500 UNIT/GM EX OINT
TOPICAL_OINTMENT | CUTANEOUS | Status: AC
Start: 2021-07-26 — End: ?
  Filled 2021-07-26: qty 28.35

## 2021-07-26 MED ORDER — ONDANSETRON HCL 4 MG PO TABS: 4.0000 mg | ORAL_TABLET | Freq: Four times a day (QID) | ORAL | Status: DC | PRN

## 2021-07-26 MED ORDER — ACETAMINOPHEN 500 MG PO TABS
1000.0000 mg | ORAL_TABLET | Freq: Four times a day (QID) | ORAL | Status: DC
  Administered 2021-07-26 – 2021-07-27 (×2): 1000 mg via ORAL
  Filled 2021-07-26 (×2): qty 2

## 2021-07-26 MED ORDER — PHENYLEPHRINE 80 MCG/ML (10ML) SYRINGE FOR IV PUSH (FOR BLOOD PRESSURE SUPPORT)
PREFILLED_SYRINGE | INTRAVENOUS | Status: AC
  Filled 2021-07-26: qty 10

## 2021-07-26 MED ORDER — CEFAZOLIN SODIUM-DEXTROSE 2-4 GM/100ML-% IV SOLN
INTRAVENOUS | Status: AC
  Filled 2021-07-26: qty 100

## 2021-07-26 MED ORDER — BACITRACIN ZINC 500 UNIT/GM EX OINT
TOPICAL_OINTMENT | CUTANEOUS | Status: DC | PRN
  Administered 2021-07-26: 1 via TOPICAL

## 2021-07-26 MED ORDER — THROMBIN 5000 UNITS EX SOLR
OROMUCOSAL | Status: DC | PRN
  Administered 2021-07-26: 5 mL via TOPICAL

## 2021-07-26 MED ORDER — FENTANYL CITRATE (PF) 100 MCG/2ML IJ SOLN
INTRAMUSCULAR | Status: DC | PRN
  Administered 2021-07-26: 50 ug via INTRAVENOUS
  Administered 2021-07-26: 25 ug via INTRAVENOUS
  Administered 2021-07-26: 50 ug via INTRAVENOUS

## 2021-07-26 MED ORDER — OXYCODONE HCL 5 MG PO TABS
5.0000 mg | ORAL_TABLET | ORAL | Status: DC | PRN
  Administered 2021-07-26: 5 mg via ORAL
  Filled 2021-07-26: qty 1

## 2021-07-26 MED ORDER — ROCURONIUM BROMIDE 10 MG/ML (PF) SYRINGE
PREFILLED_SYRINGE | INTRAVENOUS | Status: AC
  Filled 2021-07-26: qty 10

## 2021-07-26 MED ORDER — DEXAMETHASONE SODIUM PHOSPHATE 10 MG/ML IJ SOLN
INTRAMUSCULAR | Status: AC
  Filled 2021-07-26: qty 1

## 2021-07-26 MED ORDER — CEFAZOLIN SODIUM-DEXTROSE 2-4 GM/100ML-% IV SOLN
2.0000 g | Freq: Three times a day (TID) | INTRAVENOUS | Status: AC
  Administered 2021-07-26 – 2021-07-27 (×2): 2 g via INTRAVENOUS
  Filled 2021-07-26 (×2): qty 100

## 2021-07-26 MED ORDER — BISACODYL 10 MG RE SUPP: 10.0000 mg | Freq: Every day | RECTAL | Status: DC | PRN

## 2021-07-26 MED ORDER — CHLORHEXIDINE GLUCONATE CLOTH 2 % EX PADS: 6.0000 | MEDICATED_PAD | Freq: Once | CUTANEOUS | Status: DC

## 2021-07-26 MED ORDER — DOCUSATE SODIUM 100 MG PO CAPS
100.0000 mg | ORAL_CAPSULE | Freq: Two times a day (BID) | ORAL | Status: DC
  Administered 2021-07-26 – 2021-07-27 (×2): 100 mg via ORAL
  Filled 2021-07-26 (×2): qty 1

## 2021-07-26 MED ORDER — PHENYLEPHRINE HCL-NACL 20-0.9 MG/250ML-% IV SOLN
INTRAVENOUS | Status: DC | PRN
  Administered 2021-07-26: 50 ug/min via INTRAVENOUS

## 2021-07-26 MED ORDER — CHLORHEXIDINE GLUCONATE 0.12 % MT SOLN
15.0000 mL | OROMUCOSAL | Status: AC
  Filled 2021-07-26: qty 15

## 2021-07-26 MED ORDER — ACETAMINOPHEN 325 MG PO TABS: 650.0000 mg | ORAL_TABLET | ORAL | Status: DC | PRN

## 2021-07-26 MED ORDER — LACTATED RINGERS IV SOLN: INTRAVENOUS | Status: DC

## 2021-07-26 MED ORDER — ONDANSETRON HCL 4 MG/2ML IJ SOLN
INTRAMUSCULAR | Status: AC
  Filled 2021-07-26: qty 2

## 2021-07-26 MED ORDER — ACETAMINOPHEN 10 MG/ML IV SOLN
INTRAVENOUS | Status: DC | PRN
  Administered 2021-07-26: 1000 mg via INTRAVENOUS

## 2021-07-26 MED ORDER — ROSUVASTATIN CALCIUM 20 MG PO TABS
20.0000 mg | ORAL_TABLET | Freq: Every day | ORAL | Status: DC
  Administered 2021-07-26 – 2021-07-27 (×2): 20 mg via ORAL
  Filled 2021-07-26 (×2): qty 1

## 2021-07-26 MED ORDER — CEFAZOLIN SODIUM-DEXTROSE 2-4 GM/100ML-% IV SOLN
2.0000 g | INTRAVENOUS | Status: AC
  Administered 2021-07-26: 2 g via INTRAVENOUS

## 2021-07-26 MED ORDER — PHENYLEPHRINE 80 MCG/ML (10ML) SYRINGE FOR IV PUSH (FOR BLOOD PRESSURE SUPPORT)
PREFILLED_SYRINGE | INTRAVENOUS | Status: DC | PRN
  Administered 2021-07-26 (×2): 80 ug via INTRAVENOUS

## 2021-07-26 MED ORDER — EPHEDRINE 5 MG/ML INJ
INTRAVENOUS | Status: AC
  Filled 2021-07-26: qty 5

## 2021-07-26 MED ORDER — HEMOSTATIC AGENTS (NO CHARGE) OPTIME
TOPICAL | Status: DC | PRN
  Administered 2021-07-26: 1 via TOPICAL

## 2021-07-26 MED ORDER — DEXAMETHASONE SODIUM PHOSPHATE 4 MG/ML IJ SOLN
INTRAMUSCULAR | Status: DC | PRN
  Administered 2021-07-26: 10 mg via INTRAVENOUS

## 2021-07-26 MED ORDER — THROMBIN 5000 UNITS EX SOLR
CUTANEOUS | Status: AC
  Filled 2021-07-26: qty 5000

## 2021-07-26 MED ORDER — 0.9 % SODIUM CHLORIDE (POUR BTL) OPTIME
TOPICAL | Status: DC | PRN
  Administered 2021-07-26: 1000 mL

## 2021-07-26 MED ORDER — BUPIVACAINE-EPINEPHRINE (PF) 0.5% -1:200000 IJ SOLN
INTRAMUSCULAR | Status: DC | PRN
  Administered 2021-07-26: 10 mL

## 2021-07-26 MED ORDER — ACETAMINOPHEN 10 MG/ML IV SOLN
INTRAVENOUS | Status: AC
  Filled 2021-07-26: qty 100

## 2021-07-26 MED ORDER — BUPIVACAINE-EPINEPHRINE 0.5% -1:200000 IJ SOLN
INTRAMUSCULAR | Status: AC
  Filled 2021-07-26: qty 1

## 2021-07-26 MED ORDER — LIDOCAINE 2% (20 MG/ML) 5 ML SYRINGE
INTRAMUSCULAR | Status: DC | PRN
  Administered 2021-07-26: 60 mg via INTRAVENOUS

## 2021-07-26 MED ORDER — ACETAMINOPHEN 650 MG RE SUPP: 650.0000 mg | RECTAL | Status: DC | PRN

## 2021-07-26 MED ORDER — CHLORHEXIDINE GLUCONATE 0.12 % MT SOLN
OROMUCOSAL | Status: AC
  Administered 2021-07-26: 15 mL via OROMUCOSAL
  Filled 2021-07-26: qty 15

## 2021-07-26 MED ORDER — ONDANSETRON HCL 4 MG/2ML IJ SOLN
4.0000 mg | Freq: Four times a day (QID) | INTRAMUSCULAR | Status: DC | PRN
Start: 2021-07-26 — End: 2021-07-27

## 2021-07-26 MED ORDER — SODIUM CHLORIDE 0.9% FLUSH: 3.0000 mL | Freq: Two times a day (BID) | INTRAVENOUS | Status: DC

## 2021-07-26 MED ORDER — MENTHOL 3 MG MT LOZG: 1.0000 | LOZENGE | OROMUCOSAL | Status: DC | PRN

## 2021-07-26 MED ORDER — KETOROLAC TROMETHAMINE 30 MG/ML IJ SOLN
INTRAMUSCULAR | Status: DC | PRN
  Administered 2021-07-26: 30 mg via INTRAVENOUS

## 2021-07-26 MED ORDER — LIDOCAINE 2% (20 MG/ML) 5 ML SYRINGE
INTRAMUSCULAR | Status: AC
  Filled 2021-07-26: qty 5

## 2021-07-26 MED ORDER — PROPOFOL 10 MG/ML IV BOLUS
INTRAVENOUS | Status: DC | PRN
  Administered 2021-07-26: 140 mg via INTRAVENOUS

## 2021-07-26 MED ORDER — AMLODIPINE BESYLATE 5 MG PO TABS
5.0000 mg | ORAL_TABLET | Freq: Every day | ORAL | Status: DC
  Administered 2021-07-26 – 2021-07-27 (×2): 5 mg via ORAL
  Filled 2021-07-26 (×2): qty 1

## 2021-07-26 MED ORDER — SODIUM CHLORIDE 0.9% FLUSH: 3.0000 mL | INTRAVENOUS | Status: DC | PRN

## 2021-07-26 MED ORDER — ACETAMINOPHEN 500 MG PO TABS: 1000.0000 mg | ORAL_TABLET | Freq: Four times a day (QID) | ORAL | Status: DC

## 2021-07-26 MED ORDER — OXYCODONE HCL 5 MG PO TABS: 10.0000 mg | ORAL_TABLET | ORAL | Status: DC | PRN

## 2021-07-26 MED ORDER — EPHEDRINE SULFATE-NACL 50-0.9 MG/10ML-% IV SOSY
PREFILLED_SYRINGE | INTRAVENOUS | Status: DC | PRN
  Administered 2021-07-26: 5 mg via INTRAVENOUS
  Administered 2021-07-26: 10 mg via INTRAVENOUS

## 2021-07-26 SURGICAL SUPPLY — 53 items
APL SKNCLS STERI-STRIP NONHPOA (GAUZE/BANDAGES/DRESSINGS) ×1
BAG COUNTER SPONGE SURGICOUNT (BAG) ×4 IMPLANT
BAG SPNG CNTER NS LX DISP (BAG) ×3
BAND INSRT 18 STRL LF DISP RB (MISCELLANEOUS) ×2
BAND RUBBER #18 3X1/16 STRL (MISCELLANEOUS) ×4 IMPLANT
BENZOIN TINCTURE PRP APPL 2/3 (GAUZE/BANDAGES/DRESSINGS) ×2 IMPLANT
BLADE CLIPPER SURG (BLADE) ×1 IMPLANT
BUR MATCHSTICK NEURO 3.0 LAGG (BURR) ×2 IMPLANT
BUR PRECISION FLUTE 6.0 (BURR) ×2 IMPLANT
CANISTER SUCT 3000ML PPV (MISCELLANEOUS) ×2 IMPLANT
CARTRIDGE OIL MAESTRO DRILL (MISCELLANEOUS) ×1 IMPLANT
DIFFUSER DRILL AIR PNEUMATIC (MISCELLANEOUS) ×2 IMPLANT
DRAPE LAPAROTOMY 100X72X124 (DRAPES) ×2 IMPLANT
DRAPE MICROSCOPE LEICA (MISCELLANEOUS) ×2 IMPLANT
DRAPE SURG 17X23 STRL (DRAPES) ×8 IMPLANT
DRSG OPSITE POSTOP 4X6 (GAUZE/BANDAGES/DRESSINGS) ×1 IMPLANT
ELECT BLADE 4.0 EZ CLEAN MEGAD (MISCELLANEOUS) ×2
ELECT REM PT RETURN 9FT ADLT (ELECTROSURGICAL) ×2
ELECTRODE BLDE 4.0 EZ CLN MEGD (MISCELLANEOUS) ×1 IMPLANT
ELECTRODE REM PT RTRN 9FT ADLT (ELECTROSURGICAL) ×1 IMPLANT
GAUZE 4X4 16PLY ~~LOC~~+RFID DBL (SPONGE) IMPLANT
GAUZE SPONGE 4X4 12PLY STRL (GAUZE/BANDAGES/DRESSINGS) ×2 IMPLANT
GLOVE BIO SURGEON STRL SZ8 (GLOVE) ×2 IMPLANT
GLOVE BIO SURGEON STRL SZ8.5 (GLOVE) ×2 IMPLANT
GLOVE EXAM NITRILE XL STR (GLOVE) IMPLANT
GOWN STRL REUS W/ TWL LRG LVL3 (GOWN DISPOSABLE) IMPLANT
GOWN STRL REUS W/ TWL XL LVL3 (GOWN DISPOSABLE) ×1 IMPLANT
GOWN STRL REUS W/TWL 2XL LVL3 (GOWN DISPOSABLE) IMPLANT
GOWN STRL REUS W/TWL LRG LVL3 (GOWN DISPOSABLE)
GOWN STRL REUS W/TWL XL LVL3 (GOWN DISPOSABLE) ×2
GRAFT DURAGEN MATRIX 1WX1L (Tissue) ×1 IMPLANT
KIT BASIN OR (CUSTOM PROCEDURE TRAY) ×2 IMPLANT
KIT TURNOVER KIT B (KITS) ×2 IMPLANT
NDL HYPO 21X1.5 SAFETY (NEEDLE) IMPLANT
NEEDLE HYPO 21X1.5 SAFETY (NEEDLE) IMPLANT
NEEDLE HYPO 22GX1.5 SAFETY (NEEDLE) ×2 IMPLANT
NS IRRIG 1000ML POUR BTL (IV SOLUTION) ×2 IMPLANT
OIL CARTRIDGE MAESTRO DRILL (MISCELLANEOUS) ×2
PACK LAMINECTOMY NEURO (CUSTOM PROCEDURE TRAY) ×2 IMPLANT
PAD ARMBOARD 7.5X6 YLW CONV (MISCELLANEOUS) ×6 IMPLANT
PATTIES SURGICAL .5 X1 (DISPOSABLE) IMPLANT
PATTIES SURGICAL 1X1 (DISPOSABLE) ×1 IMPLANT
SEALANT ADHERUS EXTEND TIP (MISCELLANEOUS) ×1 IMPLANT
SPONGE SURGIFOAM ABS GEL 100 (HEMOSTASIS) ×1 IMPLANT
SPONGE SURGIFOAM ABS GEL SZ50 (HEMOSTASIS) ×2 IMPLANT
STRIP CLOSURE SKIN 1/2X4 (GAUZE/BANDAGES/DRESSINGS) ×2 IMPLANT
SUT PROLENE 6 0 BV (SUTURE) ×2 IMPLANT
SUT VIC AB 1 CT1 18XBRD ANBCTR (SUTURE) ×2 IMPLANT
SUT VIC AB 1 CT1 8-18 (SUTURE) ×4
SUT VIC AB 2-0 CP2 18 (SUTURE) ×4 IMPLANT
TOWEL GREEN STERILE (TOWEL DISPOSABLE) ×2 IMPLANT
TOWEL GREEN STERILE FF (TOWEL DISPOSABLE) ×2 IMPLANT
WATER STERILE IRR 1000ML POUR (IV SOLUTION) ×2 IMPLANT

## 2021-07-26 NOTE — Anesthesia Procedure Notes (Signed)
Procedure Name: Intubation Date/Time: 07/26/2021 1:28 PM  Performed by: Eulas Post, Jakari Sada W, CRNAPre-anesthesia Checklist: Patient identified, Emergency Drugs available, Suction available and Patient being monitored Patient Re-evaluated:Patient Re-evaluated prior to induction Oxygen Delivery Method: Circle system utilized Preoxygenation: Pre-oxygenation with 100% oxygen Induction Type: IV induction Ventilation: Mask ventilation without difficulty Laryngoscope Size: Miller and 2 Grade View: Grade I Tube type: Oral Tube size: 7.5 mm Number of attempts: 1 Airway Equipment and Method: Stylet and Oral airway Placement Confirmation: ETT inserted through vocal cords under direct vision, positive ETCO2 and breath sounds checked- equal and bilateral Secured at: 23 cm Tube secured with: Tape Dental Injury: Teeth and Oropharynx as per pre-operative assessment

## 2021-07-26 NOTE — H&P (Signed)
Subjective: The patient is a 66 year old white male who is complaining of back and left leg pain consistent with lumbar radiculopathy/neurogenic claudication.  He has failed medical management and was worked up with a lumbar MRI which demonstrated spinal stenosis at L3-4 and herniated disc at L4-5.  I discussed the various treatment options with him.  He has decided to proceed with surgery.  Past Medical History:  Diagnosis Date   Anemia    pt denies   Hypercholesteremia    Hypertension    Ischemic heart disease     Past Surgical History:  Procedure Laterality Date   COLONOSCOPY  05/2021   CORONARY ANGIOPLASTY  01/31/2003   LAPAROSCOPIC INGUINAL HERNIA REPAIR Bilateral    right x 2; left x1   TONSILLECTOMY     US ECHOCARDIOGRAPHY  05/27/2009    No Known Allergies  Social History   Tobacco Use   Smoking status: Former    Types: Cigarettes   Smokeless tobacco: Never  Substance Use Topics   Alcohol use: Never    Family History  Problem Relation Age of Onset   Heart attack Father    Heart attack Brother    Prior to Admission medications   Medication Sig Start Date End Date Taking? Authorizing Provider  amLODipine (NORVASC) 5 MG tablet Take 1 tablet (5 mg total) by mouth daily. 12/04/20  Yes Freada Bergeron, MD  aspirin EC 81 MG tablet Take 81 mg by mouth daily. Swallow whole.   Yes [provider]  HYDROcodone-acetaminophen (NORCO/VICODIN) 5-325 MG tablet Take 1 tablet by mouth every 4 (four) hours as needed. 07/13/21  Yes [provider]  rosuvastatin (CRESTOR) 20 MG tablet TAKE ONE TABLET BY MOUTH ONE TIME DAILY 05/22/20  Yes Freada Bergeron, MD  Tadalafil 2.5 MG TABS Take 2.5 mg by mouth daily as needed.   Yes [provider]  tiZANidine (ZANAFLEX) 2 MG tablet Take 2 mg by mouth daily as needed for muscle spasms. 07/02/21  Yes [provider]     Review of Systems  Positive ROS: As above  All other systems have been reviewed  and were otherwise negative with the exception of those mentioned in the HPI and as above.  Objective: Vital signs in last 24 hours: Temp:  [98 F (36.7 C)] 98 F (36.7 C) (07/10 1056) Pulse Rate:  [66] 66 (07/10 1056) Resp:  [17] 17 (07/10 1056) BP: (157)/(94) 157/94 (07/10 1056) SpO2:  [100 %] 100 % (07/10 1056) Weight:  [65.8 kg] 65.8 kg (07/10 1056) Estimated body mass index is 19.67 kg/m as calculated from the following:   Height as of this encounter: 6' (1.829 m).   Weight as of this encounter: 65.8 kg.   General Appearance: Alert Head: Normocephalic, without obvious abnormality, atraumatic Eyes: PERRL, conjunctiva/corneas clear, EOM's intact,    Ears: Normal  Throat: Normal  Neck: Supple, Back: unremarkable Lungs: Clear to auscultation bilaterally, respirations unlabored Heart: Regular rate and rhythm, no murmur, rub or gallop Abdomen: Soft, non-tender Extremities: Extremities normal, atraumatic, no cyanosis or edema Skin: unremarkable  NEUROLOGIC:   Mental status: alert and oriented,Motor Exam - grossly normal Sensory Exam - grossly normal Reflexes:  Coordination - grossly normal Gait - grossly normal Balance - grossly normal Cranial Nerves: I: smell Not tested  II: visual acuity  OS: Normal  OD: Normal   II: visual fields Full to confrontation  II: pupils Equal, round, reactive to light  III,VII: ptosis None  III,IV,VI: extraocular muscles  Full  ROM  V: mastication Normal  V: facial light touch sensation  Normal  V,VII: corneal reflex  Present  VII: facial muscle function - upper  Normal  VII: facial muscle function - lower Normal  VIII: hearing Not tested  IX: soft palate elevation  Normal  IX,X: gag reflex Present  XI: trapezius strength  5/5  XI: sternocleidomastoid strength 5/5  XI: neck flexion strength  5/5  XII: tongue strength  Normal    Data Review Lab Results  Component Value Date   WBC 8.0 07/26/2021   HGB 15.1 07/26/2021   HCT 43.7  07/26/2021   MCV 90.5 07/26/2021   PLT 160 07/26/2021   Lab Results  Component Value Date   NA 140 07/26/2021   K 3.8 07/26/2021   CL 110 07/26/2021   CO2 23 07/26/2021   BUN 16 07/26/2021   CREATININE 0.80 07/26/2021   GLUCOSE 97 07/26/2021   No results found for: "INR", "PROTIME"  Assessment/Plan: Lumbar spinal stenosis, lumbar disc, lumbar radiculopathy, neurogenic claudication, lumbago: I have discussed the situation with the patient.  I reviewed his imaging studies with him and pointed out the abnormalities.  We have discussed the various treatment options including surgery.  I have described the surgical treatment option of bilateral L3-4 laminotomies and a left L4-5 discectomy.  I have shown him surgical models.  I have given him a surgical pamphlet.  We have discussed the risk, benefits, alternatives, expected postop course, and likelihood of achieving our goals with surgery.  I have answered all his questions.  He has decided to proceed with surgery.   [name] 07/26/2021 12:36 PM   \

## 2021-07-26 NOTE — Transfer of Care (Signed)
Immediate Anesthesia Transfer of Care Note  Patient: Kyle Roberts  Procedure(s) Performed: BILATERAL LUMBAR THREE-FOUR LAMINECTOMY/ FORAMINOTOMY, LEFT LUMBAR FOUR-FIVE DISCECTOMY  Patient Location: PACU  Anesthesia Type:General  Level of Consciousness: awake and oriented  Airway & Oxygen Therapy: Patient Spontanous Breathing  Post-op Assessment: Report given to RN and Patient moving all extremities X 4  Post vital signs: Reviewed and stable  Last Vitals:  Vitals Value Taken Time  BP 139/77   Temp    Pulse 79 07/26/21 1600  Resp    SpO2 99 % 07/26/21 1600  Vitals shown include unvalidated device data.  Last Pain:  Vitals:   07/26/21 1120  TempSrc:   PainSc: 0-No pain      Patients Stated Pain Goal: 0 (41/29/04 7533)  Complications: No notable events documented.

## 2021-07-26 NOTE — Op Note (Signed)
Brief history: The patient is a 66 year old white male who has complained of back and left greater than right leg pain consistent with lumbar radiculopathy/neurogenic claudication.  He failed medical management and was worked up with a lumbar MRI which demonstrated lumbar spinal stenosis at L3-4 and a herniated disc at L4-5 on the left.  I discussed the various treatment options with him.  He has decided proceed with surgery.  Preoperative diagnosis: Lumbar spinal stenosis, lumbar disc, lumbar radiculopathy, neurogenic claudication, lumbago  Postoperative diagnosis: The same  Procedure: Bilateral L3-4 laminectomy/foraminotomies intervertebral discectomy to decompress the bilateral L4 nerve roots; left L4-5 discectomy using micro-dissection  Surgeon: Dr. Earle Gell  Asst.: None  Anesthesia: Gen. endotracheal  Estimated blood loss: 125 cc  Drains: None  Complications: Durotomy  Description of procedure: The patient was brought to the operating room by the anesthesia team. General endotracheal anesthesia was induced. The patient was turned to the prone position on the Wilson frame. The patient's lumbosacral region was then prepared with Betadine scrub and Betadine solution. Sterile drapes were applied.  I then injected the area to be incised with Marcaine with epinephrine solution. I then used a scalpel to make a linear midline incision over the L3-4 and L4-5 intervertebral disc space. I then used electrocautery to perform a left sided subperiosteal dissection exposing the spinous process and lamina of L3-4 and L4-5. We obtained intraoperative radiograph to confirm our location. I then inserted the [facility] retractor for exposure.  We then brought the operative microscope into the field. Under its magnification and illumination we completed the microdissection. I used a high-speed drill to perform a laminotomy at L3-4 and L4-5 on the left. I then used a Kerrison punches to widen the  laminotomy and removed the ligamentum flavum at L3-4 and L4-5 on the left. We then used microdissection to free up the thecal sac and the L4 and L5 nerve root from the epidural tissue. I then used a Kerrison punch to perform a foraminotomy at about the left L4 and L5 nerve root. We then using the nerve root retractor to gently retract the thecal sac and the L5 nerve root medially. This exposed the intervertebral disc. We identified the ruptured disc and remove it with the pituitary forceps.  I did not perform an intervertebral discectomy.  I then drilled across the midline at L3-4.  In freeing up the thecal sac medially, I created a midline durotomy with the coronary dilator.  In order to close the durotomy, I completed the L 3 laminectomy getting better access to the midline thecal sac.  I then closed the durotomy with 2 figure-of-eight 6-0 Prolene sutures.  I then used a Kerrison punch to move the right L3-4 ligamentum flavum and perform a foraminotomy about the right L4 nerve root completing the decompression L3-4.  I placed Dura-Guard and DuraSeal over the durotomy repair.  We had anesthesia Valsalva the patient there was no spinal fluid leakage.  We then obtained hemostasis using bipolar electrocautery. We irrigated the wound out with bacitracin solution. We then removed the retractor. We then reapproximated the patient's thoracolumbar fascia with interrupted #1 Vicryl suture. We then reapproximated the patient's subcutaneous tissue with interrupted 2-0 Vicryl suture. We then reapproximated patient's skin with Steri-Strips and benzoin. The was then coated with bacitracin ointment. The drapes were removed. The patient was subsequently returned to the supine position where they were extubated by the anesthesia team. The patient was then transported to the postanesthesia care unit in stable condition. All  sponge instrument and needle counts were reportedly correct at the end of this case.

## 2021-07-27 ENCOUNTER — Encounter (HOSPITAL_COMMUNITY): Payer: Self-pay | Admitting: Neurosurgery

## 2021-07-27 DIAGNOSIS — M48062 Spinal stenosis, lumbar region with neurogenic claudication: Secondary | ICD-10-CM | POA: Diagnosis not present

## 2021-07-27 MED ORDER — OXYCODONE-ACETAMINOPHEN 5-325 MG PO TABS
1.0000 | ORAL_TABLET | ORAL | Status: DC | PRN
  Administered 2021-07-27: 1 via ORAL
  Filled 2021-07-27: qty 2

## 2021-07-27 MED ORDER — DOCUSATE SODIUM 100 MG PO CAPS: 100.0000 mg | ORAL_CAPSULE | Freq: Two times a day (BID) | ORAL | 0 refills | Status: DC

## 2021-07-27 MED ORDER — OXYCODONE-ACETAMINOPHEN 5-325 MG PO TABS: 1.0000 | ORAL_TABLET | ORAL | 0 refills | Status: DC | PRN

## 2021-07-27 MED ORDER — CYCLOBENZAPRINE HCL 10 MG PO TABS: 10.0000 mg | ORAL_TABLET | Freq: Three times a day (TID) | ORAL | 0 refills | Status: DC | PRN

## 2021-07-27 NOTE — Discharge Summary (Signed)
Physician Discharge Summary     Providing Compassionate, Quality Care - Together   Patient ID: Kyle Roberts MRN: [id number] DOB/AGE: 1955/05/21 66 y.o.  Admit date: 07/26/2021 Discharge date: 07/27/2021  Admission Diagnoses: Spinal stenosis of lumbar region with neurogenic claudication  Discharge Diagnoses:  Principal Problem:   Spinal stenosis of lumbar region with neurogenic claudication   Discharged Condition: good  Hospital Course: Patient underwent bilateral L3-4 laminectomy/foraminotomies with a left L4-5 discectomy by Dr. Arnoldo Morale on 07/26/2021. He was admitted to 3C10  following recovery from anesthesia in the PACU. His postoperative course has been uncomplicated. He has worked with both physical and occupational therapies who feel the patient is ready for discharge home. He is ambulating independently and without difficulty. He is tolerating a normal diet. He is not having any bowel or bladder dysfunction. His pain is well-controlled with oral pain medication. He is ready for discharge home.   Consults: PT/OT  Significant Diagnostic Studies: radiology: DG Lumbar Spine 1 View  Result Date: 07/26/2021 CLINICAL DATA:  Localization bilateral lumbar 3 4 laminectomy and lumbar 4 5 discectomy. EXAM: LUMBAR SPINE - 1 VIEW COMPARISON:  Lumbar spine MRI 07/05/2021 FINDINGS: Cross-table portable lateral view of the lumbar spine. Instrument is seen overlying the posterior elements at the L4-L5 level. Alignment is anatomic. Disc spaces are preserved. No acute fractures. IMPRESSION: 1. Localization for lumbar laminectomy. Electronically Signed   By: Ronney Asters M.D.   On: 07/26/2021 16:53     Treatments: surgery: Bilateral L3-4 laminectomy/foraminotomies intervertebral discectomy to decompress the bilateral L4 nerve roots; left L4-5 discectomy using micro-dissection    Discharge Exam: Blood pressure 114/76, pulse 90, temperature 98.2 F (36.8 C), temperature source Oral, resp. rate  18, height 6' (1.829 m), weight 65.8 kg, SpO2 97 %.  Per report: Alert and oriented x 4 PERRLA CN II-XII grossly intact MAE, Strength and sensation intact Incision is covered with Honeycomb dressing and Steri Strips; Dressing is clean, dry, and intact   Disposition: Discharge disposition: 01-Home or Self Care       Discharge Instructions     Call MD for:  persistant nausea and vomiting   Complete by: As directed    Call MD for:  redness, tenderness, or signs of infection (pain, swelling, redness, odor or green/yellow discharge around incision site)   Complete by: As directed    Call MD for:  severe uncontrolled pain   Complete by: As directed    Call MD for:  temperature >100.4   Complete by: As directed    Diet - low sodium heart healthy   Complete by: As directed    Increase activity slowly   Complete by: As directed    No wound care   Complete by: As directed    Remove dressing in 24 hours   Complete by: As directed       Allergies as of 07/27/2021   No Known Allergies      Medication List     STOP taking these medications    HYDROcodone-acetaminophen 5-325 MG tablet Commonly known as: NORCO/VICODIN   tiZANidine 2 MG tablet Commonly known as: ZANAFLEX       TAKE these medications    amLODipine 5 MG tablet Commonly known as: NORVASC Take 1 tablet (5 mg total) by mouth daily.   aspirin EC 81 MG tablet Take 81 mg by mouth daily. Swallow whole.   cyclobenzaprine 10 MG tablet Commonly known as: FLEXERIL Take 1 tablet (10 mg total) by mouth 3 (  three) times daily as needed for muscle spasms.   docusate sodium 100 MG capsule Commonly known as: COLACE Take 1 capsule (100 mg total) by mouth 2 (two) times daily.   oxyCODONE-acetaminophen 5-325 MG tablet Commonly known as: PERCOCET/ROXICET Take 1-2 tablets by mouth every 4 (four) hours as needed for moderate pain.   rosuvastatin 20 MG tablet Commonly known as: CRESTOR TAKE ONE TABLET BY MOUTH ONE  TIME DAILY   Tadalafil 2.5 MG Tabs Take 2.5 mg by mouth daily as needed.        Follow-up Information     Newman Pies, MD. Go on 08/17/2021.   Specialty: Neurosurgery Why: First post op appointment is on 08/17/2021 at 2:00 PM. Contact information: [address] [place] [address] [phone]                 Signed: Viona Gilmore, DNP, AGNP-C Nurse Practitioner  [facility] [address] [place], [place], [place] [address] P: [phone]    F: [phone]  07/27/2021, 12:00 PM

## 2021-07-27 NOTE — Evaluation (Addendum)
Occupational Therapy Evaluation Patient Details Name: Kyle Roberts MRN: [id number] DOB: August 03, 1955 Today's Date: 07/27/2021   History of Present Illness Pt is a 66 y/o male presenting on 7/10 with back and L leg pain/radiculopathy.  Presented for elective bilateral L3-4 laminectomy/foraminotomies and L L4-5 discectomy. PMH includes: anemia, HTN, ischemic heart disease.   Clinical Impression   PTA patient independent. Admitted for above and limited by problem list below, including back precaution adherence, impaired balance, and decreased activity tolerance. Patient educated on back precautions, Adl compensatory techniques, DME/AE recommendations, activity progression and safety.  He completes bed mobility with up to min assist, transfers with min guard assist using RW, mobility with min guard to close supervision assist using RW, and ADLs with up to min assist. He requires cueing to adhere to back precautions and for posture throughout session. Spouse supportive and able to assist as needed. Based on performance today, believe pt will benefit from further OT services acutely but anticipate no further needs after dc home.      Recommendations for follow up therapy are one component of a multi-disciplinary discharge planning process, led by the attending physician.  Recommendations may be updated based on patient status, additional functional criteria and insurance authorization.   Follow Up Recommendations  No OT follow up    Assistance Recommended at Discharge Intermittent Supervision/Assistance  Patient can return home with the following A little help with walking and/or transfers;A little help with bathing/dressing/bathroom;Assistance with cooking/housework;Assist for transportation;Help with stairs or ramp for entrance    Functional Status Assessment  Patient has had a recent decline in their functional status and demonstrates the ability to make significant improvements in function in a  reasonable and predictable amount of time.  Equipment Recommendations  None recommended by OT    Recommendations for Other Services PT consult     Precautions / Restrictions Precautions Precautions: Back Precaution Booklet Issued: Yes (comment) Precaution Comments: reviewed with pt, min cueing to adhere functionally Required Braces or Orthoses:  (no brace needed) Restrictions Weight Bearing Restrictions: No      Mobility Bed Mobility Overal bed mobility: Needs Assistance Bed Mobility: Rolling, Sidelying to Sit, Sit to Sidelying Rolling: Min assist Sidelying to sit: Min guard     Sit to sidelying: Min assist General bed mobility comments: min assist to coordinate rolling (practiced to L and R), step by step cueing to sequencing getting OOB.  Min assist to bring legs back up onto bed.    Transfers Overall transfer level: Needs assistance Equipment used: None, Rolling walker (2 wheels) Transfers: Sit to/from Stand Sit to Stand: Mod assist, Min guard           General transfer comment: mod assist to stand from EOB without RW, presented RW and able to stand with R UE on RW with min guard. cueing for posture and technique.      Balance Overall balance assessment: Needs assistance Sitting-balance support: No upper extremity supported, Feet supported Sitting balance-Leahy Scale: Fair     Standing balance support: Bilateral upper extremity supported, No upper extremity supported, During functional activity Standing balance-Leahy Scale: Fair Standing balance comment: min guard dyanmically, preference to UE support                           ADL either performed or assessed with clinical judgement   ADL Overall ADL's : Needs assistance/impaired     Grooming: Min guard;Standing       Lower  Body Bathing: Sitting/lateral leans;Cueing for compensatory techniques;Minimal assistance Lower Body Bathing Details (indicate cue type and reason): educated on sitting  using figure 4 technique, spouse plans to assist as needed Upper Body Dressing : Set up;Sitting   Lower Body Dressing: Sit to/from stand;Minimal assistance;Cueing for back precautions;Cueing for compensatory techniques Lower Body Dressing Details (indicate cue type and reason): figure 4 technique with increased time, educated on compensatory techniques. min assist to min guard to stand Toilet Transfer: Min guard;Ambulation;Rolling walker (2 wheels)   Toileting- Clothing Manipulation and Hygiene: Min guard;Sit to/from stand       Functional mobility during ADLs: Min guard;Supervision/safety;Rolling walker (2 wheels);Cueing for safety General ADL Comments: min guard fading to supervision, requires cueing for hand placement and safety.  improved speed and confidence with RW vs not, cueing for posture.     Vision         Perception     Praxis      Pertinent Vitals/Pain Pain Assessment Pain Assessment: Faces Faces Pain Scale: Hurts a little bit Pain Location: back Pain Descriptors / Indicators: Discomfort, Operative site guarding Pain Intervention(s): Limited activity within patient's tolerance, Monitored during session, Repositioned     Hand Dominance     Extremity/Trunk Assessment Upper Extremity Assessment Upper Extremity Assessment: Overall WFL for tasks assessed   Lower Extremity Assessment Lower Extremity Assessment: Defer to PT evaluation   Cervical / Trunk Assessment Cervical / Trunk Assessment: Back Surgery   Communication Communication Communication: No difficulties   Cognition Arousal/Alertness: Awake/alert Behavior During Therapy: Anxious Overall Cognitive Status: Within Functional Limits for tasks assessed                                       General Comments  practiced bed mobility extensively, pt continues to be hesistant about rolling without twisting.  He reports likely will continue to sleep in recliner initally.    Exercises      Shoulder Instructions      Home Living Family/patient expects to be discharged to:: Private residence Living Arrangements: Spouse/significant other Available Help at Discharge: Family;Available 24 hours/day Type of Home: House Home Access: Stairs to enter [employer] of Steps: 2 Entrance Stairs-Rails: None Home Layout: One level     Bathroom Shower/Tub: Occupational psychologist: Standard     Home Equipment: Conservation officer, nature (2 wheels);Shower seat - built in          Prior Functioning/Environment Prior Level of Function : Independent/Modified Independent               ADLs Comments: reports limited engagement in ADLs/mobility for the last few weeks but managing, prior to independnet        OT Problem List: Decreased strength;Decreased activity tolerance;Impaired balance (sitting and/or standing);Pain;Decreased knowledge of precautions;Decreased knowledge of use of DME or AE      OT Treatment/Interventions: Self-care/ADL training;Therapeutic exercise;DME and/or AE instruction;Therapeutic activities;Patient/family education;Balance training    OT Goals(Current goals can be found in the care plan section) Acute Rehab OT Goals Patient Stated Goal: home OT Goal Formulation: With patient/family Time For Goal Achievement: 08/10/21 Potential to Achieve Goals: Good  OT Frequency: Min 2X/week    Co-evaluation              AM-PAC OT "6 Clicks" Daily Activity     Outcome Measure Help from another person eating meals?: None Help from another person taking care of  personal grooming?: A Little Help from another person toileting, which includes using toliet, bedpan, or urinal?: A Little Help from another person bathing (including washing, rinsing, drying)?: A Little Help from another person to put on and taking off regular upper body clothing?: A Little Help from another person to put on and taking off regular lower body clothing?: A Little 6 Click  Score: 19   End of Session Equipment Utilized During Treatment: Rolling walker (2 wheels);Gait belt Nurse Communication: Mobility status  Activity Tolerance: Patient tolerated treatment well Patient left: in bed;with call bell/phone within reach;with family/visitor present  OT Visit Diagnosis: Other abnormalities of gait and mobility (R26.89);Pain Pain - part of body:  (back)                Time: 7543-6067 OT Time Calculation (min): 37 min Charges:  OT General Charges $OT Visit: 1 Visit OT Evaluation $OT Eval Low Complexity: 1 Low OT Treatments $Self Care/Home Management : 8-22 mins  Jolaine Artist, OT Acute Rehabilitation Services Office [phone]   Delight Stare 07/27/2021, 11:20 AM

## 2021-07-27 NOTE — Discharge Instructions (Signed)
Wound Care Keep incision covered and dry for two days.    Do not put any creams, lotions, or ointments on incision. Leave steri-strips on back.  They will fall off by themselves. You are fine to shower. Let water run over incision and pat dry.  Activity Walk each and every day, increasing distance each day. No lifting greater than 5 lbs.  Avoid excessive back motion. No driving for 2 weeks; may ride as a passenger locally.  Diet Resume your normal diet.   Return to Work Will be discussed at your follow up appointment.  Call Your Doctor If Any of These Occur Redness, drainage, or swelling at the wound.  Temperature greater than 101 degrees. Severe pain not relieved by pain medication. Incision starts to come apart.  Follow Up Appt Call [phone] today for appointment in 2-3 weeks if you don't already have one or for any problems. 

## 2021-07-27 NOTE — Progress Notes (Signed)
Subjective: The patient is alert and pleasant.  He looks well.  He denies headaches.  He wants to mobilize.  He is urinating well.  Objective: Vital signs in last 24 hours: Temp:  [97.7 F (36.5 C)-98.3 F (36.8 C)] 98 F (36.7 C) (07/11 0341) Pulse Rate:  [59-82] 75 (07/11 0341) Resp:  [12-20] 20 (07/11 0341) BP: (93-157)/(74-94) 125/82 (07/11 0341) SpO2:  [96 %-100 %] 99 % (07/11 0341) Weight:  [65.8 kg] 65.8 kg (07/10 1056) Estimated body mass index is 19.67 kg/m as calculated from the following:   Height as of this encounter: 6' (1.829 m).   Weight as of this encounter: 65.8 kg.   Intake/Output from previous day: 07/10 0701 - 07/11 0700 In: 2140.1 [P.O.:240; I.V.:1600; IV Piggyback:300.1] Out: 1650 [Urine:1500; Blood:150] Intake/Output this shift: No intake/output data recorded.  Physical exam the patient is alert and pleasant.  His strength is normal.  Lab Results: Recent Labs    07/26/21 1145  WBC 8.0  HGB 15.1  HCT 43.7  PLT 160   BMET Recent Labs    07/26/21 1145  NA 140  K 3.8  CL 110  CO2 23  GLUCOSE 97  BUN 16  CREATININE 0.80  CALCIUM 9.1    Studies/Results: DG Lumbar Spine 1 View  Result Date: 07/26/2021 CLINICAL DATA:  Localization bilateral lumbar 3 4 laminectomy and lumbar 4 5 discectomy. EXAM: LUMBAR SPINE - 1 VIEW COMPARISON:  Lumbar spine MRI 07/05/2021 FINDINGS: Cross-table portable lateral view of the lumbar spine. Instrument is seen overlying the posterior elements at the L4-L5 level. Alignment is anatomic. Disc spaces are preserved. No acute fractures. IMPRESSION: 1. Localization for lumbar laminectomy. Electronically Signed   By: Ronney Asters M.D.   On: 07/26/2021 16:53    Assessment/Plan: Postop day #1: We will mobilize the patient.  He will likely go home later today.  I have given him verbal discharge instructions and answered all his questions.  LOS: 0 days     [name] 07/27/2021, 7:42 AM     Patient ID: Kyle Roberts, male   DOB: Feb 14, 1955, 66 y.o.   MRN: [id number]

## 2021-07-27 NOTE — Anesthesia Postprocedure Evaluation (Signed)
Anesthesia Post Note  Patient: Kyle Roberts  Procedure(s) Performed: BILATERAL LUMBAR THREE-FOUR LAMINECTOMY/ FORAMINOTOMY, LEFT LUMBAR FOUR-FIVE DISCECTOMY     Patient location during evaluation: PACU Anesthesia Type: General Level of consciousness: patient cooperative and awake Pain management: pain level controlled Vital Signs Assessment: post-procedure vital signs reviewed and stable Respiratory status: spontaneous breathing, nonlabored ventilation and respiratory function stable Cardiovascular status: blood pressure returned to baseline and stable Postop Assessment: no apparent nausea or vomiting Anesthetic complications: no   No notable events documented.   Carin Shipp

## 2021-07-27 NOTE — Plan of Care (Signed)
Pt doing well. Pt and wife given D/C instructions with verbal understanding. Rx's were sent to the pharmacy by MD. Pt's incision is clean and dry with no sign of infection. Pt's IV was removed prior to D/C. Pt D/C'd home via wheelchair per MD order. Pt is stable @ D/C and has no other needs at this time. Diora Bellizzi, RN  

## 2021-07-27 NOTE — Evaluation (Signed)
Physical Therapy Evaluation  Patient Details Name: Kyle Roberts MRN: [id number] DOB: [birth year] Today's Date: 07/27/2021  History of Present Illness  Pt is a 66 y/o male presenting on 7/10 with back and L leg pain/radiculopathy.  Presented for elective bilateral L3-4 laminectomy/foraminotomies and L L4-5 discectomy. PMH includes: anemia, HTN, ischemic heart disease.   Clinical Impression  Pt admitted with above diagnosis. At the time of PT eval, pt was able to demonstrate transfers and ambulation with gross min guard assist to supervision for safety and RW for support. Pt was educated on precautions, positioning recommendations, appropriate activity progression, and car transfer. Pt currently with functional limitations due to the deficits listed below (see PT Problem List). Pt will benefit from skilled PT to increase their independence and safety with mobility to allow discharge to the venue listed below.         Recommendations for follow up therapy are one component of a multi-disciplinary discharge planning process, led by the attending physician.  Recommendations may be updated based on patient status, additional functional criteria and insurance authorization.  Follow Up Recommendations No PT follow up      Assistance Recommended at Discharge PRN  Patient can return home with the following  A little help with walking and/or transfers;A little help with bathing/dressing/bathroom;Assistance with cooking/housework;Assist for transportation;Help with stairs or ramp for entrance    Equipment Recommendations None recommended by PT  Recommendations for Other Services       Functional Status Assessment Patient has had a recent decline in their functional status and demonstrates the ability to make significant improvements in function in a reasonable and predictable amount of time.     Precautions / Restrictions Precautions Precautions: Back Precaution Booklet Issued: Yes  (comment) Precaution Comments: Reviewed handout and pt was cued for precautions during functional mobility. Required Braces or Orthoses:  (no brace needed order) Restrictions Weight Bearing Restrictions: No      Mobility  Bed Mobility Overal bed mobility: Needs Assistance Bed Mobility: Rolling, Sidelying to Sit, Sit to Sidelying Rolling: Supervision Sidelying to sit: Supervision     Sit to sidelying: Supervision General bed mobility comments: Light supervision for safety. No assist required. Step-by-step cues for optimal log roll. HOB flat and rails lowered to simulate home environment.    Transfers Overall transfer level: Needs assistance Equipment used: None, Rolling walker (2 wheels) Transfers: Sit to/from Stand Sit to Stand: Min guard           General transfer comment: Close guard for safety as pt powered up to full stand from raised bed height.    Ambulation/Gait Ambulation/Gait assistance: Min guard, Supervision Gait Distance (Feet): 250 Feet Assistive device: Rolling walker (2 wheels) Gait Pattern/deviations: Step-through pattern, Decreased stride length, Trunk flexed Gait velocity: Decreased Gait velocity interpretation: 1.31 - 2.62 ft/sec, indicative of limited community ambulator   General Gait Details: VC's for improved posture, closer walker proximity, and forward gaze. No assist required. Progressed from min guard assist to supervision for safety by end of gait training.  Stairs Stairs: Yes Stairs assistance: Min guard Stair Management: One rail Right, Step to pattern, Forwards Number of Stairs: 3 General stair comments: VC's for sequencing and general safety. No assist required however hands on guard provided for optimal safety.  Wheelchair Mobility    Modified Rankin (Stroke Patients Only)       Balance Overall balance assessment: Needs assistance Sitting-balance support: No upper extremity supported, Feet supported Sitting balance-Leahy  Scale: Fair  Standing balance support: Bilateral upper extremity supported, No upper extremity supported, During functional activity Standing balance-Leahy Scale: Fair Standing balance comment: min guard dynamically, preference to UE support                             Pertinent Vitals/Pain Pain Assessment Pain Assessment: Faces Faces Pain Scale: Hurts a little bit Pain Location: back Pain Descriptors / Indicators: Discomfort, Operative site guarding Pain Intervention(s): Limited activity within patient's tolerance, Monitored during session, Repositioned    Home Living Family/patient expects to be discharged to:: Private residence Living Arrangements: Spouse/significant other Available Help at Discharge: Family;Available 24 hours/day Type of Home: House Home Access: Stairs to enter Entrance Stairs-Rails: None Entrance Stairs-Number of Steps: 2   Home Layout: One level Home Equipment: Conservation officer, nature (2 wheels);Shower seat - built in      Prior Function Prior Level of Function : Independent/Modified Independent             Mobility Comments: no AD ADLs Comments: reports limited engagement in ADLs/mobility for the last few weeks but managing, prior to independnet     Hand Dominance        Extremity/Trunk Assessment   Upper Extremity Assessment Upper Extremity Assessment: Overall WFL for tasks assessed    Lower Extremity Assessment Lower Extremity Assessment: Generalized weakness (Mild; consistent with pre-op diagnosis)    Cervical / Trunk Assessment Cervical / Trunk Assessment: Back Surgery  Communication   Communication: No difficulties  Cognition Arousal/Alertness: Awake/alert Behavior During Therapy: Anxious Overall Cognitive Status: Within Functional Limits for tasks assessed                                          General Comments General comments (skin integrity, edema, etc.): practiced bed mobility extensively, pt  continues to be hesistant about rolling without twisting.  He reports likely will continue to sleep in recliner initally.    Exercises     Assessment/Plan    PT Assessment Patient needs continued PT services  PT Problem List Decreased strength;Decreased activity tolerance;Decreased balance;Decreased mobility;Decreased knowledge of use of DME;Decreased safety awareness;Decreased knowledge of precautions;Pain       PT Treatment Interventions Gait training;DME instruction;Functional mobility training;Stair training;Therapeutic activities;Therapeutic exercise;Balance training;Patient/family education    PT Goals (Current goals can be found in the Care Plan section)  Acute Rehab PT Goals Patient Stated Goal: Home today PT Goal Formulation: With patient/family Time For Goal Achievement: 08/03/21 Potential to Achieve Goals: Good    Frequency Min 5X/week     Co-evaluation               AM-PAC PT "6 Clicks" Mobility  Outcome Measure Help needed turning from your back to your side while in a flat bed without using bedrails?: A Little Help needed moving from lying on your back to sitting on the side of a flat bed without using bedrails?: A Little Help needed moving to and from a bed to a chair (including a wheelchair)?: A Little Help needed standing up from a chair using your arms (e.g., wheelchair or bedside chair)?: A Little Help needed to walk in hospital room?: A Little Help needed climbing 3-5 steps with a railing? : A Little 6 Click Score: 18    End of Session Equipment Utilized During Treatment: Gait belt Activity Tolerance: Patient tolerated treatment well Patient left:  in bed;with call bell/phone within reach;with family/visitor present Nurse Communication: Mobility status PT Visit Diagnosis: Unsteadiness on feet (R26.81);Pain Pain - part of body:  (back)    Time: 1100-1126 PT Time Calculation (min) (ACUTE ONLY): 26 min   Charges:   PT Evaluation $PT Eval Low  Complexity: 1 Low PT Treatments $Gait Training: 8-22 mins        Rolinda Roan, PT, DPT Acute Rehabilitation Services Secure Chat Preferred Office: [phone]   Thelma Comp 07/27/2021, 11:42 AM

## 2021-08-03 ENCOUNTER — Other Ambulatory Visit: Payer: Self-pay

## 2021-08-03 MED ORDER — ROSUVASTATIN CALCIUM 20 MG PO TABS: 20.0000 mg | ORAL_TABLET | Freq: Every day | ORAL | 2 refills | Status: DC

## 2021-11-26 ENCOUNTER — Ambulatory Visit: Payer: Medicare PPO | Admitting: Family Medicine

## 2021-11-26 ENCOUNTER — Encounter: Payer: Self-pay | Admitting: Family Medicine

## 2021-11-26 VITALS — BP 130/78 | HR 87 | Temp 97.2°F | Ht 72.0 in | Wt 161.8 lb

## 2021-11-26 DIAGNOSIS — I259 Chronic ischemic heart disease, unspecified: Secondary | ICD-10-CM

## 2021-11-26 DIAGNOSIS — I10 Essential (primary) hypertension: Secondary | ICD-10-CM

## 2021-11-26 DIAGNOSIS — E7800 Pure hypercholesterolemia, unspecified: Secondary | ICD-10-CM

## 2021-11-26 NOTE — Progress Notes (Signed)
[facility] [address] [place] [address] Dept: [phone] Dept Fax: [phone]  Transfer of Care Office Visit  Subjective:    Patient ID: Kyle Roberts, male    DOB: [birth year], 66 y.o..   MRN: [id number]  Chief Complaint  Patient presents with   Establish Care    NP- establish care. No concerns.  Not fasting today.     History of Present Illness:  Patient is in today to establish care. Mr. Kyle Roberts was born in [place], [place]. His father was an Multimedia programmer. The family moved around a bit during his childhood. he completed high school in [place], [place]. Mr. Kyle Roberts attended [employer], majoring in Pharmacologist. 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 He has been married for ~ 38 years. He has a daughter (24) who lives in [place], [place]. he has 4 grandchildren. He denies tobacco or drug use. He only occasionally drinks alcohol.  Mr. Kyle Roberts has a history of hypertension. He is managed on amlodipine 5 mg daily.  Mr. Kyle Roberts has a history of coronary artery disease. He had a prior angioplasty with a stent placement. He is currently managed on an aspirin a day.  Mr. Kyle Roberts has a history of hyperlipidemia. He is managed on rosuvastatin 20 mg daily.  Mr. Kyle Roberts has a history of BPH with lower urinary tract symptoms. he has seen Dr. Karsten Ro (urology). He notes his is on a low dose of Cialis for this. He had been on another medicine, but had side effects.  Past Medical History: Patient Active Problem List   Diagnosis Date Noted   Spinal stenosis of lumbar region with neurogenic claudication 07/26/2021   BPH without obstruction/lower urinary tract symptoms 09/11/2015   Benign neoplasm of sigmoid colon 08/18/2015   Diverticulosis of large intestine without perforation or abscess without bleeding 08/18/2015   Ischemic heart disease    Essential hypertension    Hypercholesteremia    Past  Surgical History:  Procedure Laterality Date   COLONOSCOPY  05/2021   CORONARY ANGIOPLASTY  01/31/2003   LAPAROSCOPIC INGUINAL HERNIA REPAIR Bilateral    right x 2; left x1   LUMBAR LAMINECTOMY/DECOMPRESSION MICRODISCECTOMY N/A 07/26/2021   Procedure: BILATERAL LUMBAR THREE-FOUR LAMINECTOMY/ FORAMINOTOMY, LEFT LUMBAR FOUR-FIVE DISCECTOMY;  Surgeon: Newman Pies, MD;  Location: [facility];  Service: Neurosurgery;  Laterality: N/A;  BILATERAL LUMBAR THREE-FOUR LAMINECTOMY/ FORAMINOTOMY, LEFT LUMBAR FOUR-FIVE DISCECTOMY   TONSILLECTOMY     US ECHOCARDIOGRAPHY  05/27/2009   Family History  Problem Relation Age of Onset   Stroke Father    Heart attack Father    Cancer Brother        Lung   Heart attack Brother    Cancer Paternal Grandmother        Kidney   Outpatient Medications Prior to Visit  Medication Sig Dispense Refill   amLODipine (NORVASC) 5 MG tablet Take 1 tablet (5 mg total) by mouth daily. 90 tablet 1   aspirin EC 81 MG tablet Take 81 mg by mouth daily. Swallow whole.     rosuvastatin (CRESTOR) 20 MG tablet Take 1 tablet (20 mg total) by mouth daily. 90 tablet 2   Tadalafil 2.5 MG TABS Take 2.5 mg by mouth daily as needed.     cyclobenzaprine (FLEXERIL) 10 MG tablet Take 1 tablet (10 mg total) by mouth 3 (three) times daily as needed for muscle spasms. 30 tablet 0   docusate sodium (COLACE) 100 MG  capsule Take 1 capsule (100 mg total) by mouth 2 (two) times daily. 10 capsule 0   oxyCODONE-acetaminophen (PERCOCET/ROXICET) 5-325 MG tablet Take 1-2 tablets by mouth every 4 (four) hours as needed for moderate pain. 30 tablet 0   No facility-administered medications prior to visit.   No Known Allergies    Objective:   Today's Vitals   11/26/21 1052  BP: 130/78  Pulse: 87  Temp: (!) 97.2 F (36.2 C)  TempSrc: Temporal  SpO2: 98%  Weight: 161 lb 12.8 oz (73.4 kg)  Height: 6' (1.829 m)   Body mass index is 21.94 kg/m.   General: Well developed, well nourished. No acute  distress. Psych: Alert and oriented. Normal mood and affect.  Health Maintenance Due  Topic Date Due   Hepatitis C Screening  Never done   TETANUS/TDAP  Never done   Zoster Vaccines- Shingrix (1 of 2) Never done   COVID-19 Vaccine (4 - Pfizer risk series) 10/20/2020   Lab Results Last metabolic panel Lab Results  Component Value Date   GLUCOSE 97 07/26/2021   NA 140 07/26/2021   K 3.8 07/26/2021   CL 110 07/26/2021   CO2 23 07/26/2021   BUN 16 07/26/2021   CREATININE 0.80 07/26/2021   GFRNONAA >60 07/26/2021   CALCIUM 9.1 07/26/2021   PROT 7.3 04/02/2019   ALBUMIN 4.6 04/02/2019   LABGLOB 2.7 04/02/2019   AGRATIO 1.7 04/02/2019   BILITOT 0.8 04/02/2019   ALKPHOS 55 04/02/2019   AST 32 04/02/2019   ALT 22 04/02/2019   ANIONGAP 7 07/26/2021   Last lipids Lab Results  Component Value Date   CHOL 159 05/06/2021   HDL 45 05/06/2021   LDLCALC 101 (H) 05/06/2021   LDLDIRECT 189.8 02/08/2011   TRIG 64 05/06/2021   CHOLHDL 3.5 05/06/2021     Assessment & Plan:   1. Essential hypertension Blood pressure is adequately treated. I will continue Mr. Kyle Roberts on amlodipine 5 mg daily.  2. Ischemic heart disease Stable. No current angina. Continue aspirin 81 mg daily.  3. Hypercholesteremia Lipids this past Spring were above goal, with an LDL of 101 (target < 70). At his next visit, we will plan fasting lipids. If this remains above goal, I would recommend increasing his rosuvastatin. For now he will continue rosuvastatin 20 mg daily.   Return in about 6 months (around 05/27/2022) for Reassessment.   Haydee Salter, MD

## 2022-01-04 ENCOUNTER — Telehealth: Payer: Self-pay | Admitting: Family Medicine

## 2022-01-04 NOTE — Telephone Encounter (Signed)
Left message for patient to call back and schedule Medicare Annual Wellness Visit (AWV) in office.   If not able to come in office, please offer to do virtually or by telephone.  Left office number and my jabber [phone].  AWVI eligible as of 03/18/2019  Please schedule at anytime with Nurse Health Advisor.

## 2022-01-14 ENCOUNTER — Other Ambulatory Visit: Payer: Self-pay

## 2022-01-14 DIAGNOSIS — I2510 Atherosclerotic heart disease of native coronary artery without angina pectoris: Secondary | ICD-10-CM

## 2022-01-14 DIAGNOSIS — E7849 Other hyperlipidemia: Secondary | ICD-10-CM

## 2022-01-14 DIAGNOSIS — I119 Hypertensive heart disease without heart failure: Secondary | ICD-10-CM

## 2022-01-14 MED ORDER — AMLODIPINE BESYLATE 5 MG PO TABS: 5.0000 mg | ORAL_TABLET | Freq: Every day | ORAL | 1 refills | Status: DC

## 2022-02-15 ENCOUNTER — Encounter: Payer: Self-pay | Admitting: Family Medicine

## 2022-02-15 ENCOUNTER — Ambulatory Visit (INDEPENDENT_AMBULATORY_CARE_PROVIDER_SITE_OTHER): Payer: Medicare HMO | Admitting: Family Medicine

## 2022-02-15 VITALS — BP 138/80 | HR 64 | Temp 97.6°F | Ht 72.0 in | Wt 166.6 lb

## 2022-02-15 DIAGNOSIS — M6258 Muscle wasting and atrophy, not elsewhere classified, other site: Secondary | ICD-10-CM

## 2022-02-15 DIAGNOSIS — N400 Enlarged prostate without lower urinary tract symptoms: Secondary | ICD-10-CM

## 2022-02-15 DIAGNOSIS — I10 Essential (primary) hypertension: Secondary | ICD-10-CM

## 2022-02-15 DIAGNOSIS — R109 Unspecified abdominal pain: Secondary | ICD-10-CM

## 2022-02-15 MED ORDER — TADALAFIL 5 MG PO TABS: 2.5000 mg | ORAL_TABLET | Freq: Every day | ORAL | 11 refills | Status: DC

## 2022-02-15 NOTE — Progress Notes (Signed)
[facility] [address] [place] [address] Dept: [phone] Dept Fax: [phone]  Office Visit  Subjective:    Patient ID: Kyle Roberts, male    DOB: 17-Nov-1955, 67 y.o..   MRN: [id number]  Chief Complaint  Patient presents with   Acute Visit    Enlarged left pectoral muscle for 13-14 years-last mammogram 2011    History of Present Illness:  Patient is in today for for evaluation of an issue with an enlarged left pectoral muscle. He notes that this has been noticeable for about 14 years. He has no associated pain. He was sent int he past for a mammogram, which was normal. He denies any injury to his chest or neck. He does not recall this being present in his childhood or teens. He doesn't feel that this has necessarily changed at this point.  Kyle Roberts does note that he occasionally gets a cramp in his upper abdomen with certain movements. When this comes on, the only way to relieve this is with stretching his arms up above his head. He notes this occurs with "unusual" movements, like trying to squeeze down into his wife's care when the front seat is too far forward. This has occurred intermittently for the past 3-5 years. He notes this is occurring more frequently, which is about once a month at present.  Kyle Roberts has a history of LUTS. He notes his urologist has felt that subjectively his prostate is not enlarged. He has been treated with a daily low dose of tadalafil. He was previously on solifenacin (vesicare) as the urologist flet there was an OAB component.  Past Medical History: Patient Active Problem List   Diagnosis Date Noted   Atrophy of pectoral muscle 02/15/2022   Abdominal cramping 02/15/2022   Spinal stenosis of lumbar region with neurogenic claudication 07/26/2021   BPH without obstruction/lower urinary tract symptoms 09/11/2015   Benign neoplasm of sigmoid colon 08/18/2015   Diverticulosis of large  intestine without perforation or abscess without bleeding 08/18/2015   Ischemic heart disease    Essential hypertension    Hypercholesteremia    Past Surgical History:  Procedure Laterality Date   COLONOSCOPY  05/2021   CORONARY ANGIOPLASTY  01/31/2003   LAPAROSCOPIC INGUINAL HERNIA REPAIR Bilateral    right x 2; left x1   LUMBAR LAMINECTOMY/DECOMPRESSION MICRODISCECTOMY N/A 07/26/2021   Procedure: BILATERAL LUMBAR THREE-FOUR LAMINECTOMY/ FORAMINOTOMY, LEFT LUMBAR FOUR-FIVE DISCECTOMY;  Surgeon: Newman Pies, MD;  Location: [facility];  Service: Neurosurgery;  Laterality: N/A;  BILATERAL LUMBAR THREE-FOUR LAMINECTOMY/ FORAMINOTOMY, LEFT LUMBAR FOUR-FIVE DISCECTOMY   TONSILLECTOMY     US ECHOCARDIOGRAPHY  05/27/2009   Family History  Problem Relation Age of Onset   Stroke Father    Heart attack Father    Cancer Brother        Lung   Heart attack Brother    Cancer Paternal Grandmother        Kidney   Outpatient Medications Prior to Visit  Medication Sig Dispense Refill   amLODipine (NORVASC) 5 MG tablet Take 1 tablet (5 mg total) by mouth daily. 90 tablet 1   aspirin EC 81 MG tablet Take 81 mg by mouth daily. Swallow whole.     rosuvastatin (CRESTOR) 20 MG tablet Take 1 tablet (20 mg total) by mouth daily. 90 tablet 2   Tadalafil 2.5 MG TABS Take 2.5 mg by mouth daily as needed.     No facility-administered medications prior to visit.  No Known Allergies    Objective:   Today's Vitals   02/15/22 0806  BP: 138/80  Pulse: 64  Temp: 97.6 F (36.4 C)  TempSrc: Tympanic  SpO2: 98%  Weight: 166 lb 9.6 oz (75.6 kg)  Height: 6' (1.829 m)   Body mass index is 22.6 kg/m.   General: Well developed, well nourished. No acute distress. Chest: The left pectoral development appears normal without any palpable mass or enlarged breast tissue. The   right pectoral muscle appears underdeveloped. Pectoral muscle strength is equal bilaterally on office exam. Abdomen: Soft, non-tender.  Bowel sounds positive, normal pitch and frequency. No hepatosplenomegaly. No   rebound or guarding. No diastasis or palpable hernias are present. Psych: Alert and oriented. Normal mood and affect.  Health Maintenance Due  Topic Date Due   Hepatitis C Screening  Never done   DTaP/Tdap/Td (1 - Tdap) Never done   Zoster Vaccines- Shingrix (1 of 2) Never done     Assessment & Plan:   Problem List Items Addressed This Visit       Cardiovascular and Mediastinum   Essential hypertension    Blood pressure is mildly high. I will have Kyle Roberts monitor this at home and let me know if this persists above 135/80. For now, we will continue amlodipine 5 mg daily.      Relevant Medications   tadalafil (CIALIS) 5 MG tablet     Musculoskeletal and Integument   Atrophy of pectoral muscle - Primary    Rather than having an enlargement of the left pectoral muscle, it appears to me that Kyle Roberts has some underdevelopment/atrophy of the right pectoral muscle. In either respect, there is no increased size to breast tissue. I do not feel a repeat mammogram would be indicated. We will monitor this for now.        Genitourinary   BPH without obstruction/lower urinary tract symptoms    I will renew his tadalafil 5 mg 1/2 tab daily. We did discuss other options for management of BPH, but these would not necessarily improve OAB symptoms. He may need a follow-up with his urologist. I will check his annual PSA.      Relevant Medications   tadalafil (CIALIS) 5 MG tablet   Other Relevant Orders   PSA     Other   Abdominal cramping    The cramping Kyle Roberts is experiencing does appear to be related to his abdominal muscles. I see no physical evidence for a hernia. He had electrolytes and calcium checked last summer, which was normal. I feel this is likely benign. We did discuss him using electrolytes with bottled water on a regular basis, incase he is having some relatively low electrolytes leading to  muscle irritability.       Return in about 3 months (around 05/17/2022).   Haydee Salter, MD

## 2022-02-15 NOTE — Patient Instructions (Signed)
Check BP at home. Contact Dr. Gena Fray if persists greater than 135/80 over the next 2 weeks.

## 2022-02-15 NOTE — Assessment & Plan Note (Signed)
The cramping Kyle Roberts is experiencing does appear to be related to his abdominal muscles. I see no physical evidence for a hernia. He had electrolytes and calcium checked last summer, which was normal. I feel this is likely benign. We did discuss him using electrolytes with bottled water on a regular basis, incase he is having some relatively low electrolytes leading to muscle irritability.

## 2022-02-15 NOTE — Assessment & Plan Note (Signed)
I will renew his tadalafil 5 mg 1/2 tab daily. We did discuss other options for management of BPH, but these would not necessarily improve OAB symptoms. He may need a follow-up with his urologist. I will check his annual PSA.

## 2022-02-15 NOTE — Assessment & Plan Note (Signed)
Blood pressure is mildly high. I will have Kyle Roberts monitor this at home and let me know if this persists above 135/80. For now, we will continue amlodipine 5 mg daily.

## 2022-02-15 NOTE — Assessment & Plan Note (Signed)
Rather than having an enlargement of the left pectoral muscle, it appears to me that Kyle Roberts has some underdevelopment/atrophy of the right pectoral muscle. In either respect, there is no increased size to breast tissue. I do not feel a repeat mammogram would be indicated. We will monitor this for now.

## 2022-02-18 DIAGNOSIS — R69 Illness, unspecified: Secondary | ICD-10-CM | POA: Diagnosis not present

## 2022-03-10 DIAGNOSIS — R69 Illness, unspecified: Secondary | ICD-10-CM | POA: Diagnosis not present

## 2022-03-22 ENCOUNTER — Telehealth: Payer: Self-pay | Admitting: Family Medicine

## 2022-03-22 NOTE — Telephone Encounter (Signed)
Spoke to patient and he wanted to know if he could come by and get his blood work that was ordered at last OV.  Scheduled him with a lab appointment.  Dm/cma

## 2022-03-22 NOTE — Telephone Encounter (Signed)
Pt would like to know if he needs to fast for his upcoming test. Please call.

## 2022-03-23 ENCOUNTER — Other Ambulatory Visit: Payer: Medicare HMO

## 2022-03-24 LAB — PSA: PSA: 2.45 ng/mL (ref 0.10–4.00)

## 2022-03-31 DIAGNOSIS — R69 Illness, unspecified: Secondary | ICD-10-CM | POA: Diagnosis not present

## 2022-04-11 ENCOUNTER — Telehealth: Payer: Self-pay | Admitting: Family Medicine

## 2022-04-11 NOTE — Telephone Encounter (Signed)
Caller Name: Nabil Call back phone #: [phone]  Reason for Call: pt called to f/u on PSA labs.

## 2022-04-11 NOTE — Telephone Encounter (Signed)
Lft VM to rtn call. Dm/cma  

## 2022-04-11 NOTE — Telephone Encounter (Signed)
Pt returned call and advised normal result in mychart of 2.45. Pt said ok.

## 2022-04-12 NOTE — Telephone Encounter (Signed)
Patient notified VIA phone. Dm/cma  

## 2022-04-15 DIAGNOSIS — R69 Illness, unspecified: Secondary | ICD-10-CM | POA: Diagnosis not present

## 2022-04-16 DIAGNOSIS — R69 Illness, unspecified: Secondary | ICD-10-CM | POA: Diagnosis not present

## 2022-04-28 DIAGNOSIS — L814 Other melanin hyperpigmentation: Secondary | ICD-10-CM | POA: Diagnosis not present

## 2022-04-28 DIAGNOSIS — L821 Other seborrheic keratosis: Secondary | ICD-10-CM | POA: Diagnosis not present

## 2022-04-28 DIAGNOSIS — D225 Melanocytic nevi of trunk: Secondary | ICD-10-CM | POA: Diagnosis not present

## 2022-04-28 DIAGNOSIS — L570 Actinic keratosis: Secondary | ICD-10-CM | POA: Diagnosis not present

## 2022-05-04 ENCOUNTER — Telehealth: Payer: Self-pay | Admitting: Family Medicine

## 2022-05-04 NOTE — Telephone Encounter (Signed)
Contacted Kyle Roberts to schedule their annual wellness visit. Appointment made for 05/09/22.  [name] AWV direct phone # [phone]

## 2022-05-09 ENCOUNTER — Ambulatory Visit (INDEPENDENT_AMBULATORY_CARE_PROVIDER_SITE_OTHER): Payer: Medicare HMO

## 2022-05-09 VITALS — Ht 72.0 in | Wt 160.0 lb

## 2022-05-09 DIAGNOSIS — Z0000 Encounter for general adult medical examination without abnormal findings: Secondary | ICD-10-CM | POA: Diagnosis not present

## 2022-05-09 NOTE — Patient Instructions (Signed)
Mr. Kyle Roberts , Thank you for taking time to come for your Medicare Wellness Visit. I appreciate your ongoing commitment to your health goals. Please review the following plan we discussed and let me know if I can assist you in the future.   These are the goals we discussed:  Goals      Patient Stated     05/09/2022, no goals        This is a list of the screening recommended for you and due dates:  Health Maintenance  Topic Date Due   Hepatitis C Screening: USPSTF Recommendation to screen - Ages 38-79 yo.  Never done   DTaP/Tdap/Td vaccine (1 - Tdap) Never done   Zoster (Shingles) Vaccine (1 of 2) Never done   COVID-19 Vaccine (4 - 2023-24 season) 09/17/2021   Pneumonia Vaccine (1 of 1 - PCV) 11/27/2022*   Flu Shot  08/18/2022   Medicare Annual Wellness Visit  05/09/2023   Colon Cancer Screening  08/02/2025   HPV Vaccine  Aged Out  *Topic was postponed. The date shown is not the original due date.    Advanced directives: Please bring a copy of your POA (Power of Attorney) and/or Living Will to your next appointment.   Conditions/risks identified: none  Next appointment: Follow up in one year for your annual wellness visit.   Preventive Care 30 Years and Older, Male  Preventive care refers to lifestyle choices and visits with your health care provider that can promote health and wellness. What does preventive care include? A yearly physical exam. This is also called an annual well check. Dental exams once or twice a year. Routine eye exams. Ask your health care provider how often you should have your eyes checked. Personal lifestyle choices, including: Daily care of your teeth and gums. Regular physical activity. Eating a healthy diet. Avoiding tobacco and drug use. Limiting alcohol use. Practicing safe sex. Taking low doses of aspirin every day. Taking vitamin and mineral supplements as recommended by your health care provider. What happens during an annual well  check? The services and screenings done by your health care provider during your annual well check will depend on your age, overall health, lifestyle risk factors, and family history of disease. Counseling  Your health care provider may ask you questions about your: Alcohol use. Tobacco use. Drug use. Emotional well-being. Home and relationship well-being. Sexual activity. Eating habits. History of falls. Memory and ability to understand (cognition). Work and work Astronomer. Screening  You may have the following tests or measurements: Height, weight, and BMI. Blood pressure. Lipid and cholesterol levels. These may be checked every 5 years, or more frequently if you are over 6 years old. Skin check. Lung cancer screening. You may have this screening every year starting at age 28 if you have a 30-pack-year history of smoking and currently smoke or have quit within the past 15 years. Fecal occult blood test (FOBT) of the stool. You may have this test every year starting at age 71. Flexible sigmoidoscopy or colonoscopy. You may have a sigmoidoscopy every 5 years or a colonoscopy every 10 years starting at age 82. Prostate cancer screening. Recommendations will vary depending on your family history and other risks. Hepatitis C blood test. Hepatitis B blood test. Sexually transmitted disease (STD) testing. Diabetes screening. This is done by checking your blood sugar (glucose) after you have not eaten for a while (fasting). You may have this done every 1-3 years. Abdominal aortic aneurysm (AAA) screening. You may  need this if you are a current or former smoker. Osteoporosis. You may be screened starting at age 54 if you are at high risk. Talk with your health care provider about your test results, treatment options, and if necessary, the need for more tests. Vaccines  Your health care provider may recommend certain vaccines, such as: Influenza vaccine. This is recommended every  year. Tetanus, diphtheria, and acellular pertussis (Tdap, Td) vaccine. You may need a Td booster every 10 years. Zoster vaccine. You may need this after age 90. Pneumococcal 13-valent conjugate (PCV13) vaccine. One dose is recommended after age 91. Pneumococcal polysaccharide (PPSV23) vaccine. One dose is recommended after age 63. Talk to your health care provider about which screenings and vaccines you need and how often you need them. This information is not intended to replace advice given to you by your health care provider. Make sure you discuss any questions you have with your health care provider. Document Released: 01/30/2015 Document Revised: 09/23/2015 Document Reviewed: 11/04/2014 Elsevier Interactive Patient Education  2017 [place] Prevention in the Home Falls can cause injuries. They can happen to people of all ages. There are many things you can do to make your home safe and to help prevent falls. What can I do on the outside of my home? Regularly fix the edges of walkways and driveways and fix any cracks. Remove anything that might make you trip as you walk through a door, such as a raised step or threshold. Trim any bushes or trees on the path to your home. Use bright outdoor lighting. Clear any walking paths of anything that might make someone trip, such as rocks or tools. Regularly check to see if handrails are loose or broken. Make sure that both sides of any steps have handrails. Any raised decks and porches should have guardrails on the edges. Have any leaves, snow, or ice cleared regularly. Use sand or salt on walking paths during winter. Clean up any spills in your garage right away. This includes oil or grease spills. What can I do in the bathroom? Use night lights. Install grab bars by the toilet and in the tub and shower. Do not use towel bars as grab bars. Use non-skid mats or decals in the tub or shower. If you need to sit down in the shower, use a  plastic, non-slip stool. Keep the floor dry. Clean up any water that spills on the floor as soon as it happens. Remove soap buildup in the tub or shower regularly. Attach bath mats securely with double-sided non-slip rug tape. Do not have throw rugs and other things on the floor that can make you trip. What can I do in the bedroom? Use night lights. Make sure that you have a light by your bed that is easy to reach. Do not use any sheets or blankets that are too big for your bed. They should not hang down onto the floor. Have a firm chair that has side arms. You can use this for support while you get dressed. Do not have throw rugs and other things on the floor that can make you trip. What can I do in the kitchen? Clean up any spills right away. Avoid walking on wet floors. Keep items that you use a lot in easy-to-reach places. If you need to reach something above you, use a strong step stool that has a grab bar. Keep electrical cords out of the way. Do not use floor polish or wax that makes  floors slippery. If you must use wax, use non-skid floor wax. Do not have throw rugs and other things on the floor that can make you trip. What can I do with my stairs? Do not leave any items on the stairs. Make sure that there are handrails on both sides of the stairs and use them. Fix handrails that are broken or loose. Make sure that handrails are as long as the stairways. Check any carpeting to make sure that it is firmly attached to the stairs. Fix any carpet that is loose or worn. Avoid having throw rugs at the top or bottom of the stairs. If you do have throw rugs, attach them to the floor with carpet tape. Make sure that you have a light switch at the top of the stairs and the bottom of the stairs. If you do not have them, ask someone to add them for you. What else can I do to help prevent falls? Wear shoes that: Do not have high heels. Have rubber bottoms. Are comfortable and fit you  well. Are closed at the toe. Do not wear sandals. If you use a stepladder: Make sure that it is fully opened. Do not climb a closed stepladder. Make sure that both sides of the stepladder are locked into place. Ask someone to hold it for you, if possible. Clearly mark and make sure that you can see: Any grab bars or handrails. First and last steps. Where the edge of each step is. Use tools that help you move around (mobility aids) if they are needed. These include: Canes. Walkers. Scooters. Crutches. Turn on the lights when you go into a dark area. Replace any light bulbs as soon as they burn out. Set up your furniture so you have a clear path. Avoid moving your furniture around. If any of your floors are uneven, fix them. If there are any pets around you, be aware of where they are. Review your medicines with your doctor. Some medicines can make you feel dizzy. This can increase your chance of falling. Ask your doctor what other things that you can do to help prevent falls. This information is not intended to replace advice given to you by your health care provider. Make sure you discuss any questions you have with your health care provider. Document Released: 10/30/2008 Document Revised: 06/11/2015 Document Reviewed: 02/07/2014 Elsevier Interactive Patient Education  2017 [employer].

## 2022-05-09 NOTE — Progress Notes (Signed)
I connected with  Kyle Roberts on 05/09/22 by a audio enabled telemedicine application and verified that I am speaking with the correct person using two identifiers.  Patient Location: Home  Provider Location: Office/Clinic  I discussed the limitations of evaluation and management by telemedicine. The patient expressed understanding and agreed to proceed.  Subjective:   Kyle Roberts is a 67 y.o. male who presents for Medicare Annual/Subsequent preventive examination.  Review of Systems     Cardiac Risk Factors include: advanced age (>60men, >64 women);hypertension;male gender     Objective:    Today's Vitals   05/09/22 1457  Weight: 160 lb (72.6 kg)  Height: 6' (1.829 m)   Body mass index is 21.7 kg/m.     05/09/2022    3:00 PM 07/26/2021   11:17 AM 02/09/2016    8:11 AM  Advanced Directives  Does Patient Have a Medical Advance Directive? Yes No No  Type of Estate agent of [place];Living will    Copy of Healthcare Power of Attorney in Chart? No - copy requested    Would patient like information on creating a medical advance directive?  No - Patient declined     Current Medications (verified) Outpatient Encounter Medications as of 05/09/2022  Medication Sig   amLODipine (NORVASC) 5 MG tablet Take 1 tablet (5 mg total) by mouth daily.   aspirin EC 81 MG tablet Take 81 mg by mouth daily. Swallow whole.   rosuvastatin (CRESTOR) 20 MG tablet Take 1 tablet (20 mg total) by mouth daily.   tadalafil (CIALIS) 5 MG tablet Take 0.5 tablets (2.5 mg total) by mouth daily.   No facility-administered encounter medications on file as of 05/09/2022.    Allergies (verified) Patient has no known allergies.   History: Past Medical History:  Diagnosis Date   Anemia    pt denies   Hypercholesteremia    Hypertension    Ischemic heart disease    Past Surgical History:  Procedure Laterality Date   COLONOSCOPY  05/2021   CORONARY ANGIOPLASTY  01/31/2003    LAPAROSCOPIC INGUINAL HERNIA REPAIR Bilateral    right x 2; left x1   LUMBAR LAMINECTOMY/DECOMPRESSION MICRODISCECTOMY N/A 07/26/2021   Procedure: BILATERAL LUMBAR THREE-FOUR LAMINECTOMY/ FORAMINOTOMY, LEFT LUMBAR FOUR-FIVE DISCECTOMY;  Surgeon: Tressie Stalker, MD;  Location[facility] OR;  Service: Neurosurgery;  Laterality: N/A;  BILATERAL LUMBAR THREE-FOUR LAMINECTOMY/ FORAMINOTOMY, LEFT LUMBAR FOUR-FIVE DISCECTOMY   TONSILLECTOMY     US ECHOCARDIOGRAPHY  05/27/2009   Family History  Problem Relation Age of Onset   Stroke Father    Heart attack Father    Cancer Brother        Lung   Heart attack Brother    Cancer Paternal Grandmother        Kidney   Social History   Socioeconomic History   Marital status: Married    Spouse name: Not on file   Number of children: 1   Years of education: Not on file   Highest education level: Bachelor's degree (e.g., BA, AB, BS)  Occupational History   Not on file  Tobacco Use   Smoking status: Former    Types: Cigarettes   Smokeless tobacco: Never  Vaping Use   Vaping Use: Never used  Substance and Sexual Activity   Alcohol use: Never   Drug use: Never   Sexual activity: Yes  Other Topics Concern   Not on file  Social History Narrative   Not on file   Social Determinants  of Health   Financial Resource Strain: Low Risk  (05/09/2022)   Overall Financial Resource Strain (CARDIA)    Difficulty of Paying Living Expenses: Not hard at all  Food Insecurity: No Food Insecurity (05/09/2022)   Hunger Vital Sign    Worried About Running Out of Food in the Last Year: Never true    Ran Out of Food in the Last Year: Never true  Transportation Needs: No Transportation Needs (05/09/2022)   PRAPARE - Administrator, Civil Service (Medical): No    Lack of Transportation (Non-Medical): No  Physical Activity: Sufficiently Active (05/09/2022)   Exercise Vital Sign    Days of Exercise per Week: 3 days    Minutes of Exercise per Session: 60  min  Stress: No Stress Concern Present (05/09/2022)   [employer] of Occupational Health - Occupational Stress Questionnaire    Feeling of Stress : Not at all  Social Connections: Not on file    Tobacco Counseling Counseling given: Not Answered   Clinical Intake:  Pre-visit preparation completed: Yes  Pain : No/denies pain     Nutritional Status: BMI of 19-24  Normal Nutritional Risks: None Diabetes: No  How often do you need to have someone help you when you read instructions, pamphlets, or other written materials from your doctor or pharmacy?: 1 - Never  Diabetic? no  Interpreter Needed?: No  Information entered by :: NAllen LPN   Activities of Daily Living    05/09/2022    3:01 PM 07/26/2021   11:20 AM  In your present state of health, do you have any difficulty performing the following activities:  Hearing? 0 0  Vision? 0 0  Difficulty concentrating or making decisions? 0 0  Walking or climbing stairs? 0 0  Dressing or bathing? 0 0  Doing errands, shopping? 0   Preparing Food and eating ? N   Using the Toilet? N   In the past six months, have you accidently leaked urine? Y   Do you have problems with loss of bowel control? N   Managing your Medications? N   Managing your Finances? N   Housekeeping or managing your Housekeeping? N     Patient Care Team: Loyola Mast, MD as PCP - General (Family Medicine) Lars Masson, MD as Referring Physician (Cardiology) Ihor Gully, MD (Inactive) as Referring Physician (Urology)  Indicate any recent Medical Services you may have received from other than [facility] providers in the past year (date may be approximate).     Assessment:   This is a routine wellness examination for [place].  Hearing/Vision screen Vision Screening - Comments:: No regular eye exams  Dietary issues and exercise activities discussed: Current Exercise Habits: Home exercise routine, Type of exercise: walking, Time (Minutes): 60,  Frequency (Times/Week): 3, Weekly Exercise (Minutes/Week): 180   Goals Addressed             This Visit's Progress    Patient Stated       05/09/2022, no goals       Depression Screen    05/09/2022    3:01 PM 02/15/2022    8:10 AM 11/26/2021   10:56 AM 11/14/2016    8:25 AM 01/23/2016    3:45 PM 05/06/2015    8:36 AM 04/27/2015    8:25 AM  PHQ 2/9 Scores  PHQ - 2 Score 0 0 0 0 0 0 0    Fall Risk    05/09/2022    3:01 PM 02/15/2022  8:10 AM 11/26/2021   10:56 AM 11/14/2016    8:25 AM 01/23/2016    3:45 PM  Fall Risk   Falls in the past year? 0 1 0 No No  Number falls in past yr: 0 1 0    Injury with Fall? 0 1 0    Risk for fall due to : Medication side effect History of fall(s) No Fall Risks    Follow up Falls prevention discussed;Education provided;Falls evaluation completed Falls evaluation completed Falls evaluation completed      FALL RISK PREVENTION PERTAINING TO THE HOME:  Any stairs in or around the home? Yes  If so, are there any without handrails? No  Home free of loose throw rugs in walkways, pet beds, electrical cords, etc? Yes  Adequate lighting in your home to reduce risk of falls? Yes   ASSISTIVE DEVICES UTILIZED TO PREVENT FALLS:  Life alert? No  Use of a cane, walker or w/c? No  Grab bars in the bathroom? Yes  Shower chair or bench in shower? Yes  Elevated toilet seat or a handicapped toilet? No   TIMED UP AND GO:  Was the test performed? No .       Cognitive Function:        05/09/2022    3:02 PM  6CIT Screen  What Year? 0 points  What month? 0 points  What time? 0 points  Count back from 20 0 points  Months in reverse 0 points  Repeat phrase 0 points  Total Score 0 points    Immunizations Immunization History  Administered Date(s) Administered   PFIZER(Purple Top)SARS-COV-2 Vaccination 02/28/2019, 03/21/2019, 08/25/2020    TDAP status: Due, Education has been provided regarding the importance of this vaccine. Advised may  receive this vaccine at local pharmacy or Health Dept. Aware to provide a copy of the vaccination record if obtained from local pharmacy or Health Dept. Verbalized acceptance and understanding.  Flu Vaccine status: Declined, Education has been provided regarding the importance of this vaccine but patient still declined. Advised may receive this vaccine at local pharmacy or Health Dept. Aware to provide a copy of the vaccination record if obtained from local pharmacy or Health Dept. Verbalized acceptance and understanding.  Pneumococcal vaccine status: Declined,  Education has been provided regarding the importance of this vaccine but patient still declined. Advised may receive this vaccine at local pharmacy or Health Dept. Aware to provide a copy of the vaccination record if obtained from local pharmacy or Health Dept. Verbalized acceptance and understanding.   Covid-19 vaccine status: Completed vaccines  Qualifies for Shingles Vaccine? Yes   Zostavax completed No   Shingrix Completed?: No.    Education has been provided regarding the importance of this vaccine. Patient has been advised to call insurance company to determine out of pocket expense if they have not yet received this vaccine. Advised may also receive vaccine at local pharmacy or Health Dept. Verbalized acceptance and understanding.  Screening Tests Health Maintenance  Topic Date Due   Hepatitis C Screening  Never done   DTaP/Tdap/Td (1 - Tdap) Never done   Zoster Vaccines- Shingrix (1 of 2) Never done   COVID-19 Vaccine (4 - 2023-24 season) 09/17/2021   Medicare Annual Wellness (AWV)  07/03/2022   Pneumonia Vaccine 11+ Years old (1 of 1 - PCV) 11/27/2022 (Originally 04/02/2018)   INFLUENZA VACCINE  08/18/2022   COLONOSCOPY (Pts 45-61yrs Insurance coverage will need to be confirmed)  08/02/2025   HPV VACCINES  Aged  Out    Health Maintenance  Health Maintenance Due  Topic Date Due   Hepatitis C Screening  Never done    DTaP/Tdap/Td (1 - Tdap) Never done   Zoster Vaccines- Shingrix (1 of 2) Never done   COVID-19 Vaccine (4 - 2023-24 season) 09/17/2021   Medicare Annual Wellness (AWV)  07/03/2022    Colorectal cancer screening: Type of screening: Colonoscopy. Completed 08/03/2015. Repeat every 10 years  Lung Cancer Screening: (Low Dose CT Chest recommended if Age 60-80 years, 30 pack-year currently smoking OR have quit w/in 15years.) does not qualify.   Lung Cancer Screening Referral: no  Additional Screening:  Hepatitis C Screening: does qualify;   Vision Screening: Recommended annual ophthalmology exams for early detection of glaucoma and other disorders of the eye. Is the patient up to date with their annual eye exam?  No  Who is the provider or what is the name of the office in which the patient attends annual eye exams? none If pt is not established with a provider, would they like to be referred to a provider to establish care? No .   Dental Screening: Recommended annual dental exams for proper oral hygiene  Community Resource Referral / Chronic Care Management: CRR required this visit?  No   CCM required this visit?  No      Plan:     I have personally reviewed and noted the following in the patient's chart:   Medical and social history Use of alcohol, tobacco or illicit drugs  Current medications and supplements including opioid prescriptions. Patient is not currently taking opioid prescriptions. Functional ability and status Nutritional status Physical activity Advanced directives List of other physicians Hospitalizations, surgeries, and ER visits in previous 12 months Vitals Screenings to include cognitive, depression, and falls Referrals and appointments  In addition, I have reviewed and discussed with patient certain preventive protocols, quality metrics, and best practice recommendations. A written personalized care plan for preventive services as well as general preventive  health recommendations were provided to patient.     Barb Merino, LPN   1/61/0960   Nurse Notes: none  Due to this being a virtual visit, the after visit summary with patients personalized plan was offered to patient via mail or my-chart. to pick up at office at next visit

## 2022-05-15 NOTE — Progress Notes (Unsigned)
Cardiology Office Note:    Date:  05/17/2022   ID:  Kyle Roberts, DOB 19-Aug-1955, MRN [id number]  PCP:  Kyle Mast, MD   [facility]  Cardiologist:  None  Advanced Practice Provider:  No care team member to display Electrophysiologist:  None    Referring MD: Kyle Mast, MD    History of Present Illness:    Kyle Roberts is a 67 y.o. male with a hx of CAD s/p DES to ramus 01/31/2003, HTN and HLD who was previously followed by Kyle Roberts who now returns to clinic for follow-up.  Was last seen in clinic on 04/2021 where he was doing very well from a CV standpoint.  Today, the patient overall feels well. No chest pain, SOB, orthopnea or PND. Had back surgery 10months ago and is still recovering. Continues to walk without exertional symptoms. Blood pressure running 130/80.    Past Medical History:  Diagnosis Date   Anemia    pt denies   Hypercholesteremia    Hypertension    Ischemic heart disease     Past Surgical History:  Procedure Laterality Date   COLONOSCOPY  05/2021   CORONARY ANGIOPLASTY  01/31/2003   LAPAROSCOPIC INGUINAL HERNIA REPAIR Bilateral    right x 2; left x1   LUMBAR LAMINECTOMY/DECOMPRESSION MICRODISCECTOMY N/A 07/26/2021   Procedure: BILATERAL LUMBAR THREE-FOUR LAMINECTOMY/ FORAMINOTOMY, LEFT LUMBAR FOUR-FIVE DISCECTOMY;  Surgeon: Tressie Stalker, MD;  Location: [facility] OR;  Service: Neurosurgery;  Laterality: N/A;  BILATERAL LUMBAR THREE-FOUR LAMINECTOMY/ FORAMINOTOMY, LEFT LUMBAR FOUR-FIVE DISCECTOMY   TONSILLECTOMY     US ECHOCARDIOGRAPHY  05/27/2009    Current Medications: Current Meds  Medication Sig   amLODipine (NORVASC) 5 MG tablet Take 1 tablet (5 mg total) by mouth daily.   aspirin EC 81 MG tablet Take 81 mg by mouth daily. Swallow whole.   rosuvastatin (CRESTOR) 20 MG tablet Take 1 tablet (20 mg total) by mouth daily.   tadalafil (CIALIS) 5 MG tablet Take 0.5 tablets (2.5 mg total) by mouth daily.      Allergies:   Patient has no known allergies.   Social History   Socioeconomic History   Marital status: Married    Spouse name: Not on file   Number of children: 1   Years of education: Not on file   Highest education level: Bachelor's degree (e.g., BA, AB, BS)  Occupational History   Not on file  Tobacco Use   Smoking status: Former    Types: Cigarettes   Smokeless tobacco: Never  Vaping Use   Vaping Use: Never used  Substance and Sexual Activity   Alcohol use: Never   Drug use: Never   Sexual activity: Yes  Other Topics Concern   Not on file  Social History Narrative   Not on file   Social Determinants of Health   Financial Resource Strain: Low Risk  (05/09/2022)   Overall Financial Resource Strain (CARDIA)    Difficulty of Paying Living Expenses: Not hard at all  Food Insecurity: No Food Insecurity (05/09/2022)   Hunger Vital Sign    Worried About Running Out of Food in the Last Year: Never true    Ran Out of Food in the Last Year: Never true  Transportation Needs: No Transportation Needs (05/09/2022)   PRAPARE - Administrator, Civil Service (Medical): No    Lack of Transportation (Non-Medical): No  Physical Activity: Sufficiently Active (05/09/2022)   Exercise Vital Sign  Days of Exercise per Week: 3 days    Minutes of Exercise per Session: 60 min  Stress: No Stress Concern Present (05/09/2022)   [employer] - Occupational Stress Questionnaire    Feeling of Stress : Not at all  Social Connections: Not on file     Family History: The patient's family history includes Cancer in his brother and paternal grandmother; Heart attack in his brother and father; Stroke in his father.  ROS:   Please Roberts the history of present illness.    Review of Systems  Constitutional:  Negative for chills, diaphoresis and fever.  HENT:  Negative for hearing loss and sore throat.   Eyes:  Negative for blurred vision and redness.   Respiratory:  Negative for shortness of breath.   Cardiovascular:  Negative for chest pain, palpitations, orthopnea, claudication, leg swelling and PND.  Gastrointestinal:  Negative for melena, nausea and vomiting.  Genitourinary:  Negative for dysuria and flank pain.  Musculoskeletal:  Positive for joint pain and myalgias.  Neurological:  Negative for dizziness and loss of consciousness.  Endo/Heme/Allergies:  Negative for polydipsia.  Psychiatric/Behavioral:  Negative for substance abuse.     EKGs/Labs/Other Studies Reviewed:    The following studies were reviewed today:  TTE 2011: normal LV function mild LVH mild MR  EKG:  No new tracing   Recent Labs: 07/26/2021: BUN 16; Creatinine, Ser 0.80; Hemoglobin 15.1; Platelets 160; Potassium 3.8; Sodium 140   Recent Lipid Panel    Component Value Date/Time   CHOL 159 05/06/2021 0930   TRIG 64 05/06/2021 0930   HDL 45 05/06/2021 0930   CHOLHDL 3.5 05/06/2021 0930   CHOLHDL 4.2 11/28/2014 0905   VLDL 16 11/28/2014 0905   LDLCALC 101 (H) 05/06/2021 0930   LDLDIRECT 189.8 02/08/2011 0853     Physical Exam:    VS:  BP 134/86   Pulse 64   Ht 6' (1.829 m)   Wt 166 lb 3.2 oz (75.4 kg)   SpO2 96%   BMI 22.54 kg/m     Wt Readings from Last 3 Encounters:  05/17/22 166 lb 3.2 oz (75.4 kg)  05/09/22 160 lb (72.6 kg)  02/15/22 166 lb 9.6 oz (75.6 kg)     GEN:  Well nourished, well developed in no acute distress HEENT: Normal NECK: No JVD; No carotid bruits CARDIAC: RRR, no murmurs, rubs, gallops RESPIRATORY:  Clear to auscultation without rales, wheezing or rhonchi  ABDOMEN: Soft, non-tender, non-distended MUSCULOSKELETAL:  No edema; No deformity  SKIN: Warm and dry NEUROLOGIC:  Alert and oriented x 3 PSYCHIATRIC:  Normal affect   ASSESSMENT:    1. Coronary artery disease involving native coronary artery of native heart without angina pectoris   2. Essential hypertension   3. Mixed hyperlipidemia     PLAN:    In  order of problems listed above:  #CAD s/p PCI to ramus 01/2003: Doing well with no recurrence of angina. Active without exertional symptoms. Taking all medications as prescribed.  -Continue ASA 81mg  daily -Continue crestor 20mg  daily -No BB due to bradycardia  #HTN: Well controlled today -Continue amlodipine 5mg  daily  #HLD: -Continue crestor 20mg  daily -Check lipids today -Declined zetia on last visit  Follow-up:   1 year.   Medication Adjustments/Labs and Tests Ordered: Current medicines are reviewed at length with the patient today.  Concerns regarding medicines are outlined above.   No orders of the defined types were placed in this encounter.  No orders of  the defined types were placed in this encounter.  Patient Instructions  Medication Instructions:   Your physician recommends that you continue on your current medications as directed. Please refer to the Current Medication list given to you today.  *If you need a refill on your cardiac medications before your next appointment, please call your pharmacy*    Follow-Up: At [facility], you and your health needs are our priority.  As part of our continuing mission to provide you with exceptional heart care, we have created designated Provider Care Teams.  These Care Teams include your primary Cardiologist (physician) and Advanced Practice Providers (APPs -  Physician Assistants and Nurse Practitioners) who all work together to provide you with the care you need, when you need it.  We recommend signing up for the patient portal called "MyChart".  Sign up information is provided on this After Visit Summary.  MyChart is used to connect with patients for Virtual Visits (Telemedicine).  Patients are able to view lab/test results, encounter notes, upcoming appointments, etc.  Non-urgent messages can be sent to your provider as well.   To learn more about what you can do with MyChart, go to [email or web address].     Your next appointment:   1 year(s)  Provider:   Thayer Jew    Signed, Meriam Sprague, MD  05/17/2022 4:27 PM    [facility]

## 2022-05-16 ENCOUNTER — Ambulatory Visit: Payer: Medicare HMO | Admitting: Family Medicine

## 2022-05-17 ENCOUNTER — Encounter: Payer: Self-pay | Admitting: Cardiology

## 2022-05-17 ENCOUNTER — Ambulatory Visit: Payer: Medicare HMO | Attending: Cardiology | Admitting: Cardiology

## 2022-05-17 ENCOUNTER — Ambulatory Visit: Payer: Medicare HMO

## 2022-05-17 ENCOUNTER — Other Ambulatory Visit: Payer: Self-pay | Admitting: *Deleted

## 2022-05-17 VITALS — BP 134/86 | HR 64 | Ht 72.0 in | Wt 166.2 lb

## 2022-05-17 DIAGNOSIS — I2510 Atherosclerotic heart disease of native coronary artery without angina pectoris: Secondary | ICD-10-CM | POA: Diagnosis not present

## 2022-05-17 DIAGNOSIS — I10 Essential (primary) hypertension: Secondary | ICD-10-CM

## 2022-05-17 DIAGNOSIS — Z79899 Other long term (current) drug therapy: Secondary | ICD-10-CM

## 2022-05-17 DIAGNOSIS — E7800 Pure hypercholesterolemia, unspecified: Secondary | ICD-10-CM | POA: Diagnosis not present

## 2022-05-17 DIAGNOSIS — E782 Mixed hyperlipidemia: Secondary | ICD-10-CM

## 2022-05-17 LAB — CBC

## 2022-05-17 LAB — LIPID PANEL: LDL Chol Calc (NIH): 104 mg/dL — ABNORMAL HIGH (ref 0–99)

## 2022-05-17 LAB — BASIC METABOLIC PANEL
BUN/Creatinine Ratio: 22 (ref 10–24)
Creatinine, Ser: 0.81 mg/dL (ref 0.76–1.27)

## 2022-05-17 NOTE — Patient Instructions (Signed)
Medication Instructions:   Your physician recommends that you continue on your current medications as directed. Please refer to the Current Medication list given to you today.  *If you need a refill on your cardiac medications before your next appointment, please call your pharmacy*   Follow-Up: At  HeartCare, you and your health needs are our priority.  As part of our continuing mission to provide you with exceptional heart care, we have created designated Provider Care Teams.  These Care Teams include your primary Cardiologist (physician) and Advanced Practice Providers (APPs -  Physician Assistants and Nurse Practitioners) who all work together to provide you with the care you need, when you need it.  We recommend signing up for the patient portal called "MyChart".  Sign up information is provided on this After Visit Summary.  MyChart is used to connect with patients for Virtual Visits (Telemedicine).  Patients are able to view lab/test results, encounter notes, upcoming appointments, etc.  Non-urgent messages can be sent to your provider as well.   To learn more about what you can do with MyChart, go to [email or web address].    Your next appointment:   1 year(s)  Provider:   DR. PEMBERTON   

## 2022-05-17 NOTE — Progress Notes (Signed)
Pt is here to get his labs done before his OV appt with Dr. Shari Prows later this afternoon.  Order to check lipids, bmet, cbc, and TSH.  Pt confirmed he is fasting.  Pt will report to the lab now to have these drawn.

## 2022-05-18 ENCOUNTER — Telehealth: Payer: Self-pay

## 2022-05-18 LAB — CBC
Hematocrit: 45.5 % (ref 37.5–51.0)
MCH: 31 pg (ref 26.6–33.0)
MCHC: 34.5 g/dL (ref 31.5–35.7)
MCV: 90 fL (ref 79–97)
Platelets: 163 10*3/uL (ref 150–450)
RDW: 12.4 % (ref 11.6–15.4)

## 2022-05-18 LAB — BASIC METABOLIC PANEL
BUN: 18 mg/dL (ref 8–27)
CO2: 25 mmol/L (ref 20–29)
Calcium: 9.1 mg/dL (ref 8.6–10.2)
Chloride: 106 mmol/L (ref 96–106)
Glucose: 104 mg/dL — ABNORMAL HIGH (ref 70–99)
Potassium: 4.5 mmol/L (ref 3.5–5.2)
Sodium: 142 mmol/L (ref 134–144)
eGFR: 95 mL/min/{1.73_m2} (ref >59)

## 2022-05-18 LAB — LIPID PANEL
Chol/HDL Ratio: 3.6 ratio (ref 0.0–5.0)
Cholesterol, Total: 164 mg/dL (ref 100–199)
HDL: 45 mg/dL (ref >39)
Triglycerides: 77 mg/dL (ref 0–149)
VLDL Cholesterol Cal: 15 mg/dL (ref 5–40)

## 2022-05-18 LAB — TSH: TSH: 1.09 u[IU]/mL (ref 0.450–4.500)

## 2022-05-18 NOTE — Telephone Encounter (Signed)
Patient states he will keep medications the same. He states he is starting to do more exercise and mean it this time.

## 2022-05-18 NOTE — Telephone Encounter (Signed)
-----   Message from Meriam Sprague, MD sent at 05/18/2022  6:48 AM EDT ----- Kidney function, electrolytes look good. Blood counts are stable. His cholesterol is similar to prior. His LDL is above goal at 104. In the past, he has not wanted to add medications. Does he want to keep his cholesterol lowering meds the same or is he willing to start zetia 10mg  daily to drive his cholesterol lower? If so, would repeat cholesterol in 8 weeks to ensure at target.

## 2022-06-23 DIAGNOSIS — R69 Illness, unspecified: Secondary | ICD-10-CM | POA: Diagnosis not present

## 2022-07-15 DIAGNOSIS — R69 Illness, unspecified: Secondary | ICD-10-CM | POA: Diagnosis not present

## 2022-07-25 DIAGNOSIS — R69 Illness, unspecified: Secondary | ICD-10-CM | POA: Diagnosis not present

## 2022-07-29 DIAGNOSIS — R69 Illness, unspecified: Secondary | ICD-10-CM | POA: Diagnosis not present

## 2022-08-19 DIAGNOSIS — R69 Illness, unspecified: Secondary | ICD-10-CM | POA: Diagnosis not present

## 2022-08-26 ENCOUNTER — Telehealth: Payer: Self-pay | Admitting: Cardiology

## 2022-08-26 MED ORDER — ROSUVASTATIN CALCIUM 20 MG PO TABS: 20.0000 mg | ORAL_TABLET | Freq: Every day | ORAL | 2 refills | Status: DC

## 2022-08-26 NOTE — Telephone Encounter (Signed)
Pt's medication was sent to pt's pharmacy as requested. Confirmation received.  °

## 2022-08-26 NOTE — Telephone Encounter (Signed)
*  STAT* If patient is at the pharmacy, call can be transferred to refill team.   1. Which medications need to be refilled? (please list name of each medication and dose if known) rosuvastatin (CRESTOR) 20 MG tablet   2. Which pharmacy/location (including street and city if local pharmacy) is medication to be sent to?  [facility] [address] [place], [place] - [address] AT [facility] [address]    3. Do they need a 30 day or 90 day supply? 90

## 2022-08-30 DIAGNOSIS — R69 Illness, unspecified: Secondary | ICD-10-CM | POA: Diagnosis not present

## 2022-08-31 ENCOUNTER — Encounter: Payer: Self-pay | Admitting: Family Medicine

## 2022-08-31 ENCOUNTER — Ambulatory Visit (INDEPENDENT_AMBULATORY_CARE_PROVIDER_SITE_OTHER): Payer: Medicare HMO | Admitting: Family Medicine

## 2022-08-31 VITALS — BP 126/74 | HR 70 | Temp 98.2°F | Ht 72.0 in | Wt 169.8 lb

## 2022-08-31 DIAGNOSIS — M7022 Olecranon bursitis, left elbow: Secondary | ICD-10-CM

## 2022-08-31 DIAGNOSIS — M20012 Mallet finger of left finger(s): Secondary | ICD-10-CM

## 2022-08-31 DIAGNOSIS — S63630A Sprain of interphalangeal joint of right index finger, initial encounter: Secondary | ICD-10-CM

## 2022-08-31 MED ORDER — NAPROXEN 500 MG PO TABS
500.0000 mg | ORAL_TABLET | Freq: Two times a day (BID) | ORAL | 0 refills | Status: AC
Start: 2022-08-31 — End: ?

## 2022-08-31 NOTE — Progress Notes (Signed)
[facility] [address] [place] [address] Dept: [phone] Dept Fax: [phone]  Office Visit  Subjective:    Patient ID: Kyle Roberts, male    DOB: Apr 07, 1955, 67 y.o..   MRN: [id number]  Chief Complaint  Patient presents with   Hand Pain    C//o having pain in two fingers on LT hand x 5-6 days.  And also has swelling on LT elbow.     History of Present Illness:  Patient is in today with several orthopedic complaints.  He notes that about a week ago, he was helping to put a fitted sheet on the bed. His left 5th finger got caught in the band and then snapped. He notes the terminal finger was dropped and slightly rotated. He has been buddy taping this to the next finger.  Additionally, he recently caught his right 2nd finger while cleaning his car. He feels this suddenly rotated the finger. He has had pain in the DIP and PIP joints of the finger, though not significant swelling.  Over the last week or so, he has had swelling over his left elbow. This is only mildly tender. He does admit that he has made several long round trips to the beach, driving 4 hours each time.  Past Medical History: Patient Active Problem List   Diagnosis Date Noted   Atrophy of pectoral muscle 02/15/2022   Abdominal cramping 02/15/2022   Spinal stenosis of lumbar region with neurogenic claudication 07/26/2021   BPH without obstruction/lower urinary tract symptoms 09/11/2015   Benign neoplasm of sigmoid colon 08/18/2015   Diverticulosis of large intestine without perforation or abscess without bleeding 08/18/2015   Ischemic heart disease    Essential hypertension    Hypercholesteremia    Past Surgical History:  Procedure Laterality Date   COLONOSCOPY  05/2021   CORONARY ANGIOPLASTY  01/31/2003   LAPAROSCOPIC INGUINAL HERNIA REPAIR Bilateral    right x 2; left x1   LUMBAR LAMINECTOMY/DECOMPRESSION MICRODISCECTOMY N/A 07/26/2021    Procedure: BILATERAL LUMBAR THREE-FOUR LAMINECTOMY/ FORAMINOTOMY, LEFT LUMBAR FOUR-FIVE DISCECTOMY;  Surgeon: Tressie Stalker, MD;  Location: [facility] OR;  Service: Neurosurgery;  Laterality: N/A;  BILATERAL LUMBAR THREE-FOUR LAMINECTOMY/ FORAMINOTOMY, LEFT LUMBAR FOUR-FIVE DISCECTOMY   TONSILLECTOMY     US ECHOCARDIOGRAPHY  05/27/2009   Family History  Problem Relation Age of Onset   Stroke Father    Heart attack Father    Cancer Brother        Lung   Heart attack Brother    Cancer Paternal Grandmother        Kidney   Outpatient Medications Prior to Visit  Medication Sig Dispense Refill   amLODipine (NORVASC) 5 MG tablet Take 1 tablet (5 mg total) by mouth daily. 90 tablet 1   aspirin EC 81 MG tablet Take 81 mg by mouth daily. Swallow whole.     rosuvastatin (CRESTOR) 20 MG tablet Take 1 tablet (20 mg total) by mouth daily. 90 tablet 2   tadalafil (CIALIS) 5 MG tablet Take 0.5 tablets (2.5 mg total) by mouth daily. 30 tablet 11   No facility-administered medications prior to visit.   No Known Allergies   Objective:   Today's Vitals   08/31/22 1349  BP: 126/74  Pulse: 70  Temp: 98.2 F (36.8 C)  TempSrc: Temporal  SpO2: 97%  Weight: 169 lb 12.8 oz (77 kg)  Height: 6' (1.829 m)   Body mass index is 23.03 kg/m.   General: Well developed,  well nourished. No acute distress. Extremities: There is a mallet finger deformity of the left 5th terminal phalanx. No significant swelling currently. There   right 2nd finger has some nodular changes to the DIP and PIP joint. No significant swelling or deformity. There is a  mild to moderately swollen olecranon bursa over the left elbow. No redness or significant pain. Psych: Alert and oriented. Normal mood and affect.    Assessment & Plan:   Problem List Items Addressed This Visit   None Visit Diagnoses     Mallet finger, left- 5th    -  Primary   I buddy taped this with a splint to the palmar aspect. I recommend he keep this splinted  for 6 weeks. If not resolved, would then consider referral.   Relevant Medications   naproxen (NAPROSYN) 500 MG tablet   Sprain of interphalangeal joint of right index finger, initial encounter       Likely sprained finger. I recommend a 7-day course of naproxen.   Olecranon bursitis of left elbow       Likely due to friction while driving. Discussed padding this or avoiding resting on his elbow. He shoudl get an elbow elastic sleeve. Naproxen shoudl help.   Relevant Medications   naproxen (NAPROSYN) 500 MG tablet       Return in about 6 weeks (around 10/12/2022) for Reassessment.   Loyola Mast, MD

## 2022-08-31 NOTE — Patient Instructions (Signed)
Elbow Bursitis  Elbow (olecranon) bursitis is the swelling of the fluid-filled sac (bursa) between the tip of your elbow and your skin. A bursa acts as a cushion and protects the joint. If the bursa becomes irritated, it can fill with extra fluid and become inflamed. This condition is also called olecranon bursitis. What are the causes? This condition may be caused by: An elbow injury, such as falling onto the elbow. Leaning on hard surfaces for long periods of time. Infection from an injury that breaks the skin near the elbow. A bone growth (spur) that forms at the tip of the elbow. A medical condition that causes inflammation, such as gout or rheumatoid arthritis. Sometimes the cause is not known. What are the signs or symptoms? The first sign of elbow bursitis is usually swelling at the tip of the elbow. This can grow to be about the size of a golf ball. Swelling may start suddenly or develop gradually. Other symptoms may include: Pain when bending or leaning on the elbow. Stiffness, or not being able to move the elbow normally. If bursitis is caused by an infection, you may have: Redness, warmth, and tenderness of the elbow. Drainage of pus from the swollen area over the elbow, if the skin breaks open. How is this diagnosed? This condition may be diagnosed based on: Your symptoms and medical history. Any recent injuries you have had. A physical exam. X-rays to check for a bone spur or fracture. Draining fluid from the bursa to test it for infection. Blood tests to rule out gout or rheumatoid arthritis. How is this treated? Treatment for this condition depends on the cause. Treatment may include: Medicines. These may include: Over-the-counter medicines to relieve pain and inflammation. Antibiotic medicines if there is an infection. Injections of anti-inflammatory medicines (steroids). Draining fluid from the bursa. Wrapping your elbow with a bandage or compression arm  sleeve. Wearing elbow pads. If these treatments do not help, you may need surgery to remove the bursa. Follow these instructions at home: Medicines Take over-the-counter and prescription medicines only as told by your health care provider. If you were prescribed an antibiotic medicine, take it as told by your health care provider. Do not stop taking the antibiotic even if you start to feel better. Managing pain, stiffness, and swelling     If directed, put ice on the affected area. To do this: Put ice in a plastic bag. Place a towel between your skin and the bag. Leave the ice on for 20 minutes, 2-3 times a day. Remove the ice if your skin turns bright red. This is very important. If you cannot feel pain, heat, or cold, you have a greater risk of damage to the area. If directed, apply heat to the affected area as often as told by your health care provider. Use the heat source that your health care provider recommends, such as a moist heat pack or a heating pad. Place a towel between your skin and the heat source. Leave the heat on 20-30 minutes. Remove the heat if your skin turns bright red. This is especially important if you are unable to feel pain, heat, or cold. You may have a greater risk of getting burned. If your bursitis is caused by an injury, rest your elbow and wear your bandage or sleeve as told by your health care provider. Use elbow pads or elbow wraps to cushion your elbow and control swelling as needed. General instructions Avoid any activities that cause elbow pain. Ask  your health care provider what activities are safe for you. Keep all follow-up visits. This is important. Contact a health care provider if: You have a fever. Your symptoms do not get better with treatment. You have pain or swelling that gets worse, or it goes away and then comes back. You have pus draining from your elbow. You have redness around the elbow area. Your elbow is warm to the touch. Get  help right away if: You have trouble moving your arm, hand, or fingers. Summary Elbow (olecranon) bursitis is the swelling of the fluid-filled sac (bursa) between the tip of your elbow and your skin. Treatment for elbow bursitis depends on the cause. It may include medicines to relieve pain and inflammation, antibiotic medicines to treat an infection, or draining fluid from your elbow. Contact a health care provider if your symptoms do not get better with treatment, or if your symptoms go away and then come back. This information is not intended to replace advice given to you by your health care provider. Make sure you discuss any questions you have with your health care provider. Document Revised: 12/29/2020 Document Reviewed: 12/29/2020 Elsevier Patient Education  2024 [employer].

## 2022-09-29 DIAGNOSIS — R69 Illness, unspecified: Secondary | ICD-10-CM | POA: Diagnosis not present

## 2022-10-20 DIAGNOSIS — R69 Illness, unspecified: Secondary | ICD-10-CM | POA: Diagnosis not present

## 2022-10-21 ENCOUNTER — Telehealth: Payer: Self-pay | Admitting: Family Medicine

## 2022-10-21 DIAGNOSIS — M20012 Mallet finger of left finger(s): Secondary | ICD-10-CM

## 2022-10-21 NOTE — Telephone Encounter (Signed)
Kyle Roberts [phone]   Pt was here on 8/14 for an exam of his pinky finger. He feels the splint has not done what he had hoped. He stated it just doesn't feel quite right. He would like a referral to an orthopedic dr. He is not against coming in if he needs to. Next week if he needs as he is going out of town.

## 2022-10-24 DIAGNOSIS — R69 Illness, unspecified: Secondary | ICD-10-CM | POA: Diagnosis not present

## 2022-10-24 NOTE — Telephone Encounter (Signed)
Patient notified VIA phone. Dm/cma  

## 2022-10-27 ENCOUNTER — Other Ambulatory Visit (INDEPENDENT_AMBULATORY_CARE_PROVIDER_SITE_OTHER): Payer: Medicare HMO

## 2022-10-27 ENCOUNTER — Ambulatory Visit (INDEPENDENT_AMBULATORY_CARE_PROVIDER_SITE_OTHER): Payer: Medicare HMO | Admitting: Orthopedic Surgery

## 2022-10-27 DIAGNOSIS — M20012 Mallet finger of left finger(s): Secondary | ICD-10-CM | POA: Diagnosis not present

## 2022-10-27 NOTE — Progress Notes (Signed)
Kyle Roberts - 67 y.o. male MRN [id number]  Date of birth: 08-Jan-1956  Office Visit Note: Visit Date: 10/27/2022 PCP: Loyola Mast, MD Referred by: Loyola Mast, MD  Subjective: No chief complaint on file.  HPI: Kyle Roberts is a pleasant 67 y.o. male who presents today for evaluation of a left small finger mallet injury sustained approximately 7 weeks prior.  He states he was splinted by his primary care doctor, however there were issues with compliance and appropriate fit of the splint as this involved both the ring and small finger.  Pertinent ROS were reviewed with the patient and found to be negative unless otherwise specified above in HPI.   Visit Reason: left small finger Duration of symptoms: 7 weeks Hand dominance: right Occupation:Retired Diabetic: No Smoking: No Heart/Lung History: none Blood Thinners:  baby aspirin  Prior Testing/EMG: none Injections (Date): none Treatments: splint for 5 weeks Prior Surgery: none  *not very compliant with splint, unsure if splinted correctly* *mallet finger of small finger*    Assessment & Plan: Visit Diagnoses:  1. Mallet finger of left hand     Plan: Extensive discussion was had with the patient today regarding his left small finger mallet injury.  X-rays were reviewed today which confirm a soft tissue mallet injury.  I explained to him the mechanism of this injury as well as the underlying issue with his soft tissue extensor apparatus.  We explained the chronicity of his injury and the issue given that there has been poor compliance with the splint to date.  This is likely why he continues to have some extensor lag at the left small finger DIP.  Currently, his pain is well-controlled, his extensor lag is minimal.  I discussed a variety of treatment options with him ranging from conservative to surgical.  From a conservative standpoint, I did explain that we can often treat chronic mallet injury with extensive  splinting and therapy.  We also discussed the possibility for DIP joint pinning and reconstruction of the extensor apparatus as his joint is supple.  There is no notable bony injury or instability of the DIP joint which is good.  Risks and benefits of the procedure were discussed, risks including but not limited to infection, bleeding, scarring, stiffness, nerve injury, tendon injury, vascular injury, hardware complication if utilized, recurrence of symptoms and need for subsequent operation.  Patient expressed understanding.    Understanding the above, he would like to trial extension splinting for the time being.  I did emphasize compliance with this regimen.  Mallet splint was fitted today, he will utilize this for the next 6 weeks and return to me at that juncture for repeat evaluation.  At that point, we can determine if he is demonstrating appropriate healing or if we need to explore a more aggressive route of treatment.  He expressed full understanding.  Follow-up: No follow-ups on file.   Meds & Orders: No orders of the defined types were placed in this encounter.   Orders Placed This Encounter  Procedures   XR Hand Complete Left     Procedures: No procedures performed      Clinical History: No specialty comments available.  He reports that he has quit smoking. His smoking use included cigarettes. He has never used smokeless tobacco. No results for input(s): "HGBA1C", "LABURIC" in the last 8760 hours.  Objective:   Vital Signs: There were no vitals taken for this visit.  Physical Exam  Gen: Well-appearing, in  no acute distress; non-toxic CV: Regular Rate. Well-perfused. Warm.  Resp: Breathing unlabored on room air; no wheezing. Psych: Fluid speech in conversation; appropriate affect; normal thought process  Ortho Exam Left hand: - Small finger with slight extensor lag at the DIP, approximately 10 degrees, not actively correctable - Small finger DIP joint is supple, able to  perform passive range of motion to complete extension - Hand is warm well-perfused, sensation intact globally  Imaging: XR Hand Complete Left  Result Date: 10/27/2022 X-rays of the left hand were performed today, emphasis on the small finger DIP Small finger DIP is well located in all planes, no evidence of bony injury.  Joint line is congruent in all planes.   Past Medical/Family/Surgical/Social History: Medications & Allergies reviewed per EMR, new medications updated. Patient Active Problem List   Diagnosis Date Noted   Atrophy of pectoral muscle 02/15/2022   Abdominal cramping 02/15/2022   Spinal stenosis of lumbar region with neurogenic claudication 07/26/2021   BPH without obstruction/lower urinary tract symptoms 09/11/2015   Benign neoplasm of sigmoid colon 08/18/2015   Diverticulosis of large intestine without perforation or abscess without bleeding 08/18/2015   Ischemic heart disease    Essential hypertension    Hypercholesteremia    Past Medical History:  Diagnosis Date   Anemia    pt denies   Hypercholesteremia    Hypertension    Ischemic heart disease    Family History  Problem Relation Age of Onset   Stroke Father    Heart attack Father    Cancer Brother        Lung   Heart attack Brother    Cancer Paternal Grandmother        Kidney   Past Surgical History:  Procedure Laterality Date   COLONOSCOPY  05/2021   CORONARY ANGIOPLASTY  01/31/2003   LAPAROSCOPIC INGUINAL HERNIA REPAIR Bilateral    right x 2; left x1   LUMBAR LAMINECTOMY/DECOMPRESSION MICRODISCECTOMY N/A 07/26/2021   Procedure: BILATERAL LUMBAR THREE-FOUR LAMINECTOMY/ FORAMINOTOMY, LEFT LUMBAR FOUR-FIVE DISCECTOMY;  Surgeon: Tressie Stalker, MD;  Location: [facility] OR;  Service: Neurosurgery;  Laterality: N/A;  BILATERAL LUMBAR THREE-FOUR LAMINECTOMY/ FORAMINOTOMY, LEFT LUMBAR FOUR-FIVE DISCECTOMY   TONSILLECTOMY     US ECHOCARDIOGRAPHY  05/27/2009   Social History   Occupational History   Not on  file  Tobacco Use   Smoking status: Former    Types: Cigarettes   Smokeless tobacco: Never  Vaping Use   Vaping status: Never Used  Substance and Sexual Activity   Alcohol use: Yes    Comment: socially   Drug use: Never   Sexual activity: Yes    Wing Gfeller Trevor Mace, M.D. [facility] 10:25 AM

## 2022-11-10 DIAGNOSIS — E785 Hyperlipidemia, unspecified: Secondary | ICD-10-CM | POA: Diagnosis not present

## 2022-11-10 DIAGNOSIS — Z823 Family history of stroke: Secondary | ICD-10-CM | POA: Diagnosis not present

## 2022-11-10 DIAGNOSIS — I2510 Atherosclerotic heart disease of native coronary artery without angina pectoris: Secondary | ICD-10-CM | POA: Diagnosis not present

## 2022-11-10 DIAGNOSIS — Z87891 Personal history of nicotine dependence: Secondary | ICD-10-CM | POA: Diagnosis not present

## 2022-11-10 DIAGNOSIS — I739 Peripheral vascular disease, unspecified: Secondary | ICD-10-CM | POA: Diagnosis not present

## 2022-11-10 DIAGNOSIS — R69 Illness, unspecified: Secondary | ICD-10-CM | POA: Diagnosis not present

## 2022-11-10 DIAGNOSIS — Z833 Family history of diabetes mellitus: Secondary | ICD-10-CM | POA: Diagnosis not present

## 2022-11-10 DIAGNOSIS — N181 Chronic kidney disease, stage 1: Secondary | ICD-10-CM | POA: Diagnosis not present

## 2022-11-10 DIAGNOSIS — R32 Unspecified urinary incontinence: Secondary | ICD-10-CM | POA: Diagnosis not present

## 2022-11-10 DIAGNOSIS — Z95820 Peripheral vascular angioplasty status with implants and grafts: Secondary | ICD-10-CM | POA: Diagnosis not present

## 2022-11-10 DIAGNOSIS — Z8249 Family history of ischemic heart disease and other diseases of the circulatory system: Secondary | ICD-10-CM | POA: Diagnosis not present

## 2022-11-10 DIAGNOSIS — I129 Hypertensive chronic kidney disease with stage 1 through stage 4 chronic kidney disease, or unspecified chronic kidney disease: Secondary | ICD-10-CM | POA: Diagnosis not present

## 2022-11-10 DIAGNOSIS — Z008 Encounter for other general examination: Secondary | ICD-10-CM | POA: Diagnosis not present

## 2022-11-10 DIAGNOSIS — N400 Enlarged prostate without lower urinary tract symptoms: Secondary | ICD-10-CM | POA: Diagnosis not present

## 2022-11-17 DIAGNOSIS — R69 Illness, unspecified: Secondary | ICD-10-CM | POA: Diagnosis not present

## 2022-11-24 DIAGNOSIS — R69 Illness, unspecified: Secondary | ICD-10-CM | POA: Diagnosis not present

## 2022-11-28 ENCOUNTER — Ambulatory Visit: Payer: Medicare HMO | Admitting: Orthopedic Surgery

## 2022-12-05 ENCOUNTER — Other Ambulatory Visit: Payer: Self-pay

## 2022-12-05 DIAGNOSIS — E7849 Other hyperlipidemia: Secondary | ICD-10-CM

## 2022-12-05 DIAGNOSIS — I119 Hypertensive heart disease without heart failure: Secondary | ICD-10-CM

## 2022-12-05 DIAGNOSIS — I2510 Atherosclerotic heart disease of native coronary artery without angina pectoris: Secondary | ICD-10-CM

## 2022-12-05 MED ORDER — AMLODIPINE BESYLATE 5 MG PO TABS
5.0000 mg | ORAL_TABLET | Freq: Every day | ORAL | 1 refills | Status: DC
Start: 2022-12-05 — End: 2023-06-05

## 2023-01-25 DIAGNOSIS — K080 Exfoliation of teeth due to systemic causes: Secondary | ICD-10-CM | POA: Diagnosis not present

## 2023-03-28 DIAGNOSIS — H5213 Myopia, bilateral: Secondary | ICD-10-CM | POA: Diagnosis not present

## 2023-03-31 IMAGING — MR MR LUMBAR SPINE W/O CM
4 of 5 series · 26 of 48 positions shown · non-contrast
Comparison: None Available.

CLINICAL DATA: Low back pain, left leg pain for 4 days. No known
injury.

EXAM:
MRI LUMBAR SPINE WITHOUT CONTRAST
TECHNIQUE: Multiplanar, multisequence MR imaging of the lumbar spine was
performed. No intravenous contrast was administered.

[Series 14: T2 · sagittal · 4.0mm · 0.76mm/px · 6 of 17 slices shown (1 of 2)]
[im 1/17]
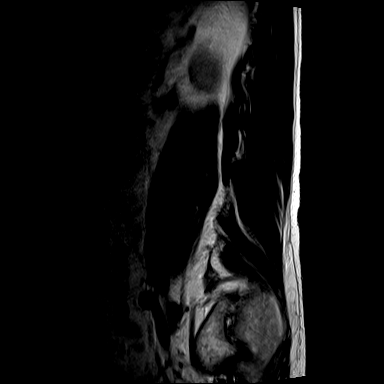
[im 4/17]
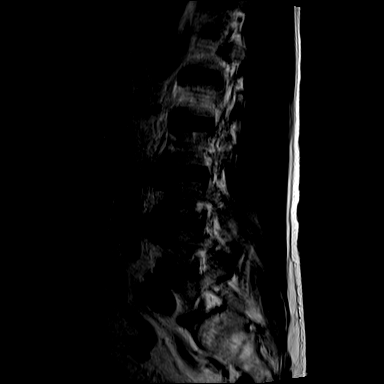
[im 7/17]
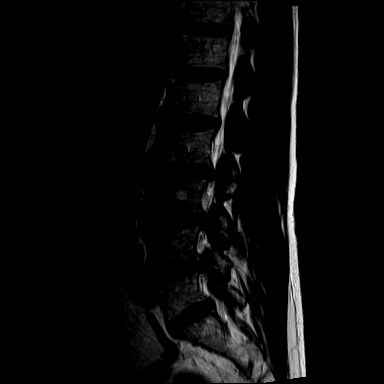
[im 10/17]
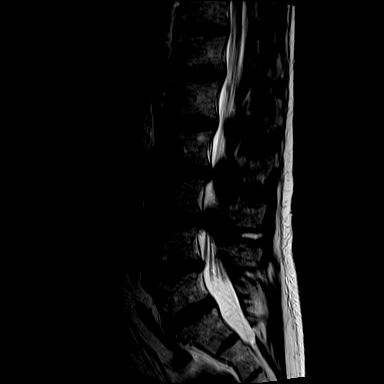
[im 13/17]
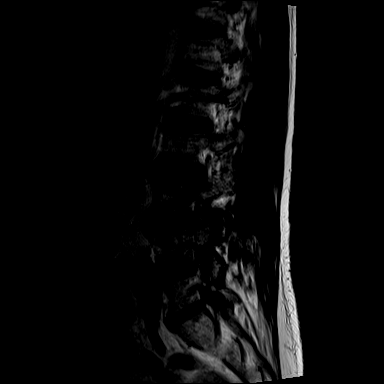
[im 17/17]
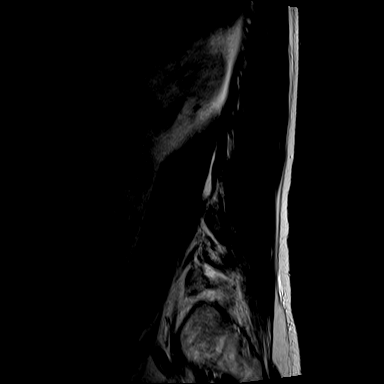

[Series 15: T1 · sagittal · 4.0mm · 0.91mm/px · 6 of 17 slices shown (1 of 2)]
[im 1/17]
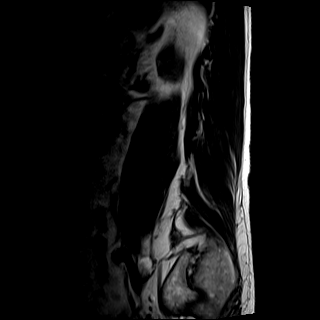
[im 4/17]
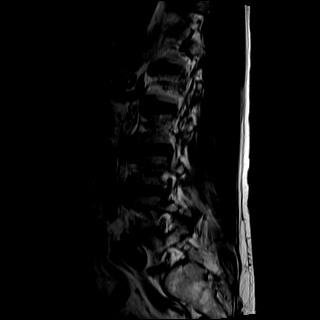
[im 7/17]
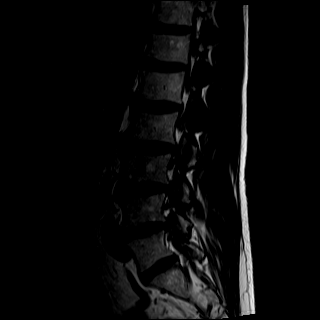
[im 10/17]
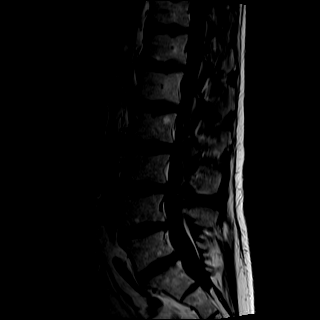
[im 13/17]
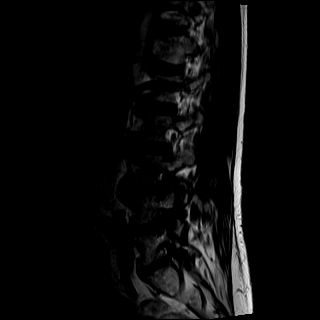
[im 17/17]
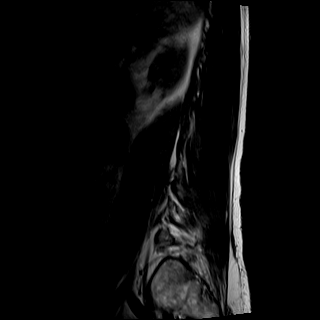

[Series 16: T2 · axial · 4.0mm · 0.57mm/px · z∈[-142,+114]mm · 9 of 44 slices shown (2 of 2)]
[im 1/44]
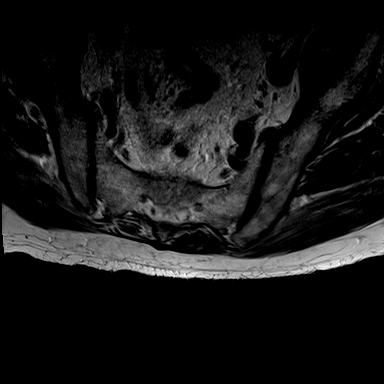
[im 7/44]
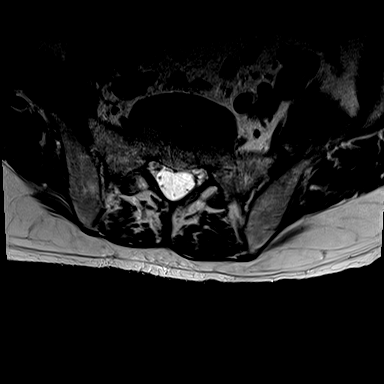
[im 13/44]
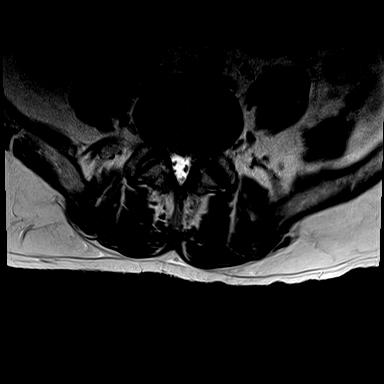
[im 19/44]
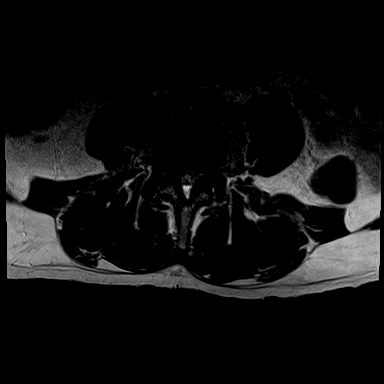
[im 22/44]
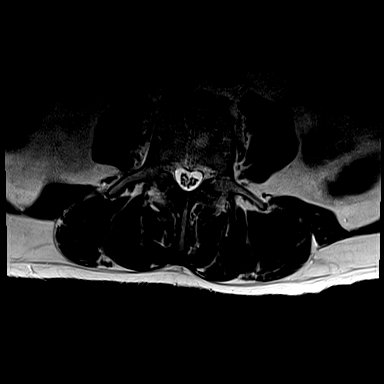
[im 25/44]
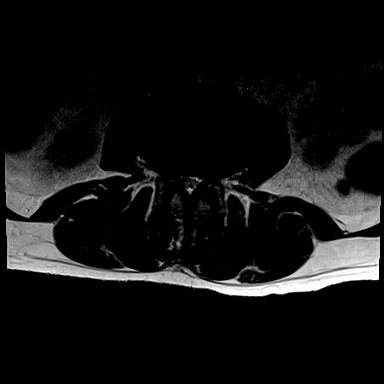
[im 31/44]
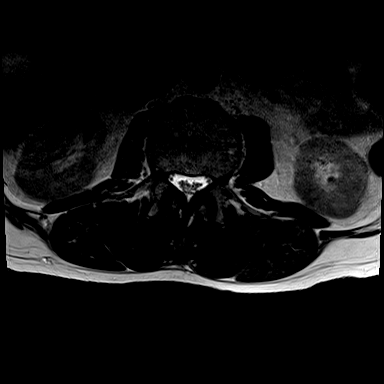
[im 37/44]
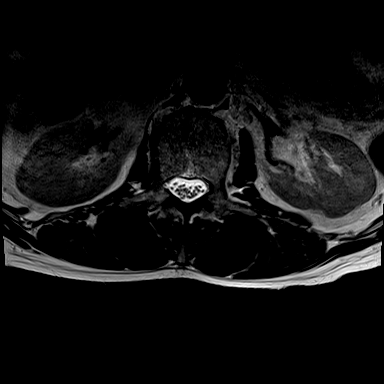
[im 44/44]
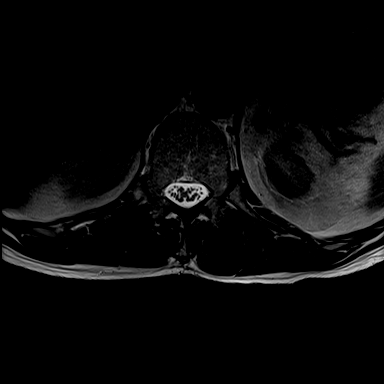

[Series 17: T1 · axial · 4.0mm · 0.34mm/px · z∈[-142,+79]mm · 5 of 44 slices shown (2 of 2)]
[im 1/44]
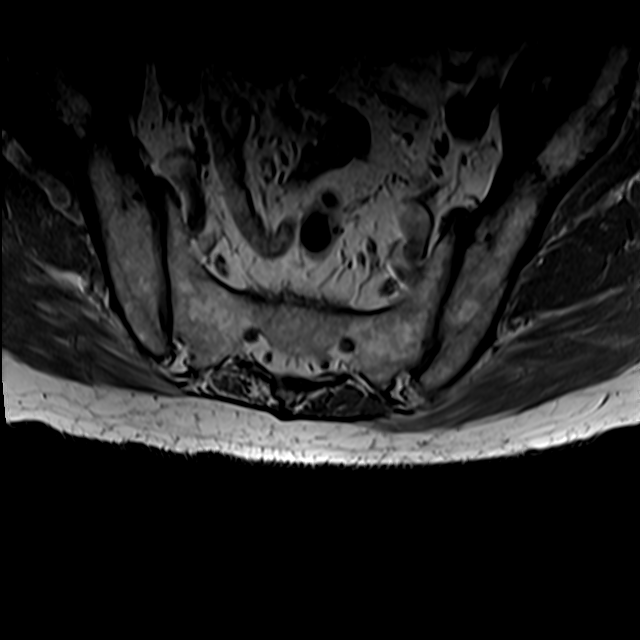
[im 7/44]
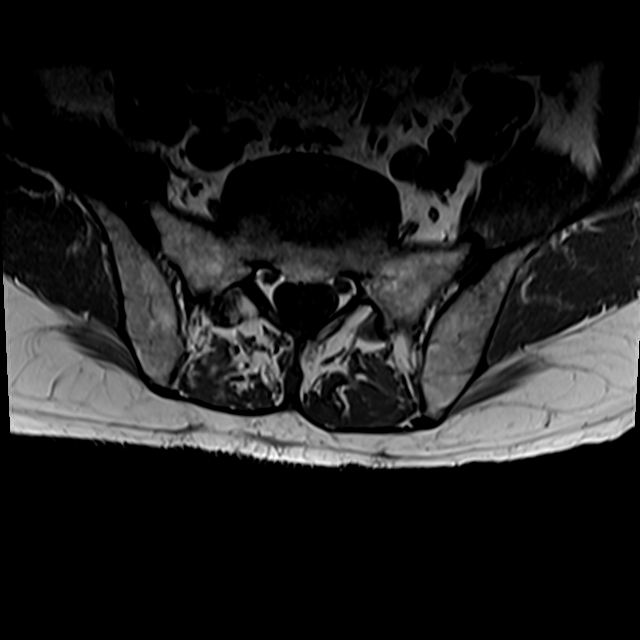
[im 13/44]
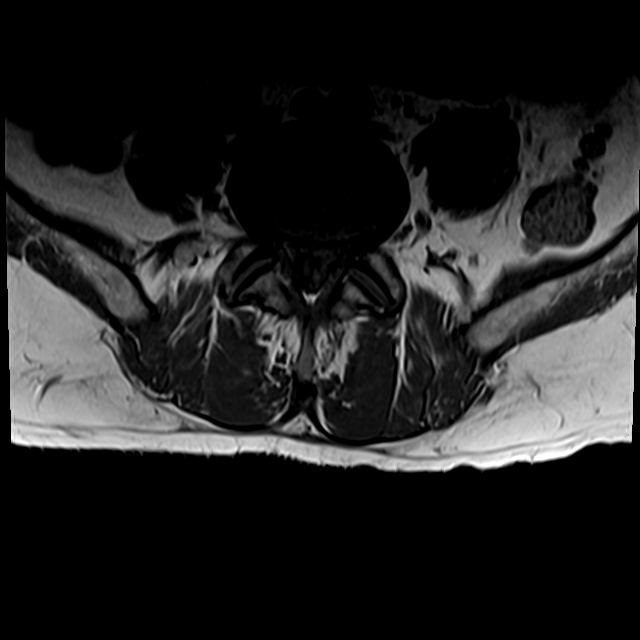
[im 22/44]
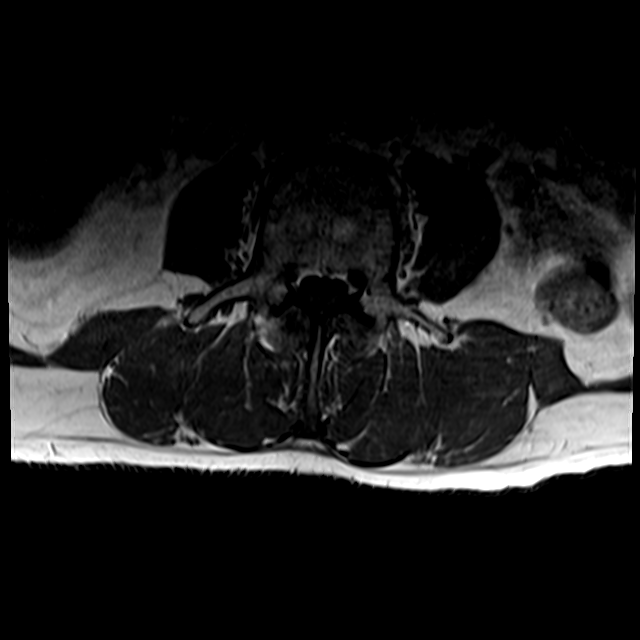
[im 37/44]
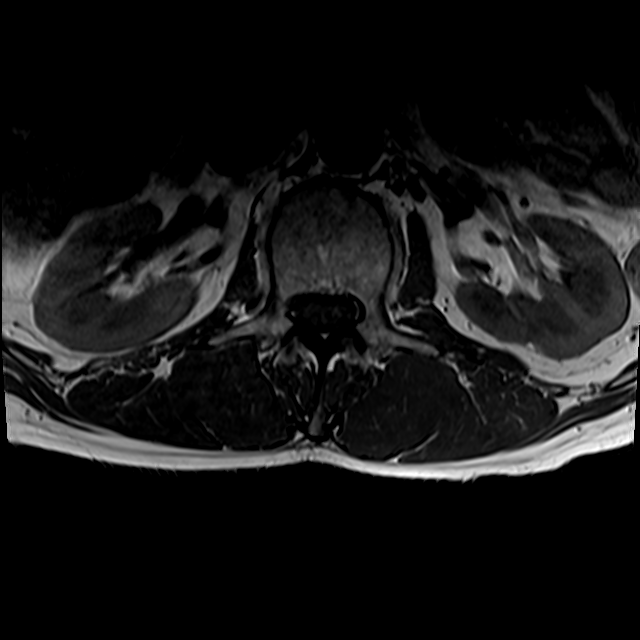

[26 of 48 positions shown; findings below may reference images not displayed]

FINDINGS: Segmentation:  Standard.

Alignment:  Physiologic.

Vertebrae: No acute fracture, evidence of discitis, or aggressive
bone lesion.

Conus medullaris and cauda equina: Conus extends to the T11-12
level. Conus and cauda equina appear normal.

Paraspinal and other soft tissues: No acute paraspinal abnormality.

Disc levels:

Disc spaces: Mild degenerative disease with disc height loss at L2-3
L4-5 and L5-S1. Disc desiccation at L2-3, L3-4, L4-5 and L5-S1.

T12-L1: No significant disc bulge. No neural foraminal stenosis. No
central canal stenosis.

L1-L2: No disc protrusion. No foraminal stenosis. Mild bilateral
facet arthropathy. Mild spinal stenosis.

L2-L3: Mild broad-based disc bulge. Moderate bilateral facet
arthropathy with ligamentum flavum infolding. Moderate spinal
stenosis. No foraminal stenosis.

L3-L4: Broad-based disc bulge with a small central disc protrusion.
Moderate bilateral facet arthropathy. Severe spinal stenosis. Mild
left and moderate right foraminal stenosis.

L4-L5: Broad-based disc bulge with a left paracentral disc extrusion
with mass effect on the left intraspinal L5 nerve root. Moderate
left foraminal stenosis. Moderate right foraminal stenosis. No
spinal stenosis.

L5-S1: Mild broad-based disc bulge. Mild bilateral facet
arthropathy. No foraminal or central canal stenosis.
IMPRESSION: 1. At L4-5 there is a broad-based disc bulge with a left paracentral
disc extrusion with mass effect on the left intraspinal L5 nerve
root. Moderate left foraminal stenosis. Moderate right foraminal
stenosis.
2. At L3-4 there is a broad-based disc bulge with a small central
disc protrusion. Moderate bilateral facet arthropathy. Severe spinal
stenosis. Mild left and moderate right foraminal stenosis.
3. At L2-3 there is a mild broad-based disc bulge. Moderate
bilateral facet arthropathy with ligamentum flavum infolding.
Moderate spinal stenosis.
4.  No acute osseous injury of the lumbar spine.

## 2023-04-05 ENCOUNTER — Other Ambulatory Visit: Payer: Self-pay

## 2023-04-05 DIAGNOSIS — N400 Enlarged prostate without lower urinary tract symptoms: Secondary | ICD-10-CM

## 2023-04-05 MED ORDER — TADALAFIL 5 MG PO TABS: 2.5000 mg | ORAL_TABLET | Freq: Every day | ORAL | 11 refills | Status: AC

## 2023-04-05 NOTE — Telephone Encounter (Signed)
 Refill request for  Tadalafil 5 mg LR 02/15/22,  #30, 11 rf LOV  08/31/22 FOV   none scheduled.   Please review and advise.  Thanks. Dm/cma

## 2023-05-15 NOTE — Progress Notes (Unsigned)
  Cardiology Office Note:  .   Date:  05/16/2023  ID:  Kyle Roberts, DOB Jul 20, 1955, MRN [id number] PCP: Graig Lawyer, MD  [facility] Providers Cardiologist:  None    History of Present Illness: .    May 16, 2023  Kyle Roberts is a 68 y.o. male with a history of coronary artery disease, status post DES to the ramus intermediate branch in January, 2005, hypertension, hyperlipidemia.  He is a previous patient of Dr. Nicholette Barley and Dr. Ardell Beauvais.  I am meeting him today for the first time.  No CP , no dyspnea His LDL is around 100 on previous lab draws Will add zetia 10 mg a day and check lipids in 3 months    ROS:   Studies Reviewed: [name]   EKG Interpretation Date/Time:  Tuesday May 16 2023 08:22:04 EDT Ventricular Rate:  63 PR Interval:  160 QRS Duration:  96 QT Interval:  412 QTC Calculation: 421 R Axis:   -46  Text Interpretation: Sinus rhythm with occasional Premature ventricular complexes Left anterior fascicular block When compared with ECG of 26-Jul-2021 10:57, Premature ventricular complexes are now Present Confirmed by Ahmad Alert ([id number]) on 05/16/2023 8:40:56 AM     Risk Assessment/Calculations:             Physical Exam:   VS:  BP 122/80   Pulse 63   Ht 6' (1.829 m)   Wt 174 lb (78.9 kg)   SpO2 98%   BMI 23.60 kg/m    Wt Readings from Last 3 Encounters:  05/16/23 174 lb (78.9 kg)  08/31/22 169 lb 12.8 oz (77 kg)  05/17/22 166 lb 3.2 oz (75.4 kg)    GEN: Well nourished, well developed in no acute distress NECK: No JVD; No carotid bruits CARDIAC: RRR, no murmurs, rubs, gallops RESPIRATORY:  Clear to auscultation without rales, wheezing or rhonchi  ABDOMEN: Soft, non-tender, non-distended EXTREMITIES:  No edema; No deformity   ASSESSMENT AND PLAN: .      CAD:   no angina ,  he has done well since his stanting of his RI in 2005   2.  HLD :   add zetia.  Check lipids, ALT and BMP in 3 months          Dispo: 1 year     Signed, Ahmad Alert, MD

## 2023-05-16 ENCOUNTER — Encounter: Payer: Self-pay | Admitting: Cardiovascular Disease

## 2023-05-16 ENCOUNTER — Ambulatory Visit: Payer: Medicare HMO | Attending: Cardiovascular Disease | Admitting: Cardiovascular Disease

## 2023-05-16 VITALS — BP 122/80 | HR 63 | Ht 72.0 in | Wt 174.0 lb

## 2023-05-16 DIAGNOSIS — E7800 Pure hypercholesterolemia, unspecified: Secondary | ICD-10-CM | POA: Diagnosis not present

## 2023-05-16 DIAGNOSIS — E782 Mixed hyperlipidemia: Secondary | ICD-10-CM | POA: Diagnosis not present

## 2023-05-16 DIAGNOSIS — I10 Essential (primary) hypertension: Secondary | ICD-10-CM | POA: Diagnosis not present

## 2023-05-16 DIAGNOSIS — I259 Chronic ischemic heart disease, unspecified: Secondary | ICD-10-CM

## 2023-05-16 DIAGNOSIS — I2510 Atherosclerotic heart disease of native coronary artery without angina pectoris: Secondary | ICD-10-CM | POA: Diagnosis not present

## 2023-05-16 MED ORDER — EZETIMIBE 10 MG PO TABS: 10.0000 mg | ORAL_TABLET | Freq: Every day | ORAL | 3 refills | Status: AC

## 2023-05-16 NOTE — Patient Instructions (Signed)
 Medication Instructions:  START Zetia/Ezetimibe 10mg  daily *If you need a refill on your cardiac medications before your next appointment, please call your pharmacy*  Lab Work: Lipids, ALT, BMET in 3 months If you have labs (blood work) drawn today and your tests are completely normal, you will receive your results only by: MyChart Message (if you have MyChart) OR A paper copy in the mail If you have any lab test that is abnormal or we need to change your treatment, we will call you to review the results.  Follow-Up: At [facility], you and your health needs are our priority.  As part of our continuing mission to provide you with exceptional heart care, our providers are all part of one team.  This team includes your primary Cardiologist (physician) and Advanced Practice Providers or APPs (Physician Assistants and Nurse Practitioners) who all work together to provide you with the care you need, when you need it.  Your next appointment:   1 year(s)  Provider:   Ahmad Alert, MD

## 2023-06-05 ENCOUNTER — Other Ambulatory Visit: Payer: Self-pay

## 2023-06-05 DIAGNOSIS — I2510 Atherosclerotic heart disease of native coronary artery without angina pectoris: Secondary | ICD-10-CM

## 2023-06-05 DIAGNOSIS — I119 Hypertensive heart disease without heart failure: Secondary | ICD-10-CM

## 2023-06-05 DIAGNOSIS — E7849 Other hyperlipidemia: Secondary | ICD-10-CM

## 2023-06-05 MED ORDER — AMLODIPINE BESYLATE 5 MG PO TABS: 5.0000 mg | ORAL_TABLET | Freq: Every day | ORAL | 3 refills | Status: AC

## 2023-07-26 DIAGNOSIS — K080 Exfoliation of teeth due to systemic causes: Secondary | ICD-10-CM | POA: Diagnosis not present

## 2023-09-19 DIAGNOSIS — E782 Mixed hyperlipidemia: Secondary | ICD-10-CM | POA: Diagnosis not present

## 2023-09-19 DIAGNOSIS — I10 Essential (primary) hypertension: Secondary | ICD-10-CM | POA: Diagnosis not present

## 2023-09-19 DIAGNOSIS — I2510 Atherosclerotic heart disease of native coronary artery without angina pectoris: Secondary | ICD-10-CM | POA: Diagnosis not present

## 2023-09-20 LAB — LIPID PANEL
Chol/HDL Ratio: 3.6 ratio (ref 0.0–5.0)
Cholesterol, Total: 142 mg/dL (ref 100–199)
HDL: 39 mg/dL — ABNORMAL LOW (ref >39)
LDL Chol Calc (NIH): 90 mg/dL (ref 0–99)
Triglycerides: 61 mg/dL (ref 0–149)
VLDL Cholesterol Cal: 13 mg/dL (ref 5–40)

## 2023-09-20 LAB — BASIC METABOLIC PANEL WITH GFR
BUN/Creatinine Ratio: 18 (ref 10–24)
BUN: 15 mg/dL (ref 8–27)
CO2: 22 mmol/L (ref 20–29)
Calcium: 9.2 mg/dL (ref 8.6–10.2)
Chloride: 104 mmol/L (ref 96–106)
Creatinine, Ser: 0.83 mg/dL (ref 0.76–1.27)
Glucose: 119 mg/dL — ABNORMAL HIGH (ref 70–99)
Potassium: 4.3 mmol/L (ref 3.5–5.2)
Sodium: 143 mmol/L (ref 134–144)
eGFR: 94 mL/min/1.73 (ref >59)

## 2023-09-20 LAB — ALT: ALT: 19 IU/L (ref 0–44)

## 2023-09-21 ENCOUNTER — Ambulatory Visit: Payer: Self-pay

## 2023-09-21 DIAGNOSIS — E782 Mixed hyperlipidemia: Secondary | ICD-10-CM

## 2023-09-21 DIAGNOSIS — I2510 Atherosclerotic heart disease of native coronary artery without angina pectoris: Secondary | ICD-10-CM

## 2023-09-27 ENCOUNTER — Other Ambulatory Visit: Payer: Self-pay

## 2023-09-27 MED ORDER — ROSUVASTATIN CALCIUM 20 MG PO TABS: 20.0000 mg | ORAL_TABLET | Freq: Every day | ORAL | 2 refills | Status: AC

## 2023-11-14 DIAGNOSIS — L578 Other skin changes due to chronic exposure to nonionizing radiation: Secondary | ICD-10-CM | POA: Diagnosis not present

## 2023-11-14 DIAGNOSIS — L821 Other seborrheic keratosis: Secondary | ICD-10-CM | POA: Diagnosis not present

## 2023-11-14 DIAGNOSIS — L814 Other melanin hyperpigmentation: Secondary | ICD-10-CM | POA: Diagnosis not present

## 2023-11-14 DIAGNOSIS — L570 Actinic keratosis: Secondary | ICD-10-CM | POA: Diagnosis not present

## 2023-11-14 DIAGNOSIS — D225 Melanocytic nevi of trunk: Secondary | ICD-10-CM | POA: Diagnosis not present

## 2023-11-20 ENCOUNTER — Encounter: Payer: Self-pay | Admitting: Radiology

## 2024-01-30 ENCOUNTER — Ambulatory Visit

## 2024-01-30 VITALS — BP 122/70 | HR 72 | Temp 98.6°F | Ht 72.5 in | Wt 181.4 lb

## 2024-01-30 DIAGNOSIS — Z0000 Encounter for general adult medical examination without abnormal findings: Secondary | ICD-10-CM

## 2024-01-30 NOTE — Progress Notes (Signed)
 "  Chief Complaint  Patient presents with   Medicare Wellness     Subjective:   Kyle Roberts is a 69 y.o. male who presents for a Medicare Annual Wellness Visit.  Visit info / Clinical Intake: Medicare Wellness Visit Type:: Subsequent Annual Wellness Visit Persons participating in visit and providing information:: patient Medicare Wellness Visit Mode:: In-person (required for WTM) Interpreter Needed?: No Pre-visit prep was completed: yes AWV questionnaire completed by patient prior to visit?: no Living arrangements:: lives with spouse/significant other Patient's Overall Health Status Rating: good Typical amount of pain: none Does pain affect daily life?: no Are you currently prescribed opioids?: no  Dietary Habits and Nutritional Risks How many meals a day?: 3 Eats fruit and vegetables daily?: yes Most meals are obtained by: preparing own meals; eating out In the last 2 weeks, have you had any of the following?: none Diabetic:: no  Functional Status Activities of Daily Living (to include ambulation/medication): Independent Ambulation: Independent Medication Administration: Independent Home Management (perform basic housework or laundry): Independent Manage your own finances?: yes Primary transportation is: driving Concerns about vision?: no *vision screening is required for WTM* Concerns about hearing?: no  Fall Screening Falls in the past year?: 0 Number of falls in past year: 0 Was there an injury with Fall?: 0 Fall Risk Category Calculator: 0 Patient Fall Risk Level: Low Fall Risk  Fall Risk Patient at Risk for Falls Due to: Medication side effect Fall risk Follow up: Falls prevention discussed; Falls evaluation completed  Home and Transportation Safety: All rugs have non-skid backing?: yes All stairs or steps have railings?: yes Grab bars in the bathtub or shower?: yes Have non-skid surface in bathtub or shower?: yes Good home lighting?: yes Regular seat  belt use?: yes Hospital stays in the last year:: no  Cognitive Assessment Difficulty concentrating, remembering, or making decisions? : no Will 6CIT or Mini Cog be Completed: yes What year is it?: 0 points What month is it?: 0 points Give patient an address phrase to remember (5 components): [address] [place] About what time is it?: 0 points Count backwards from 20 to 1: 0 points Say the months of the year in reverse: 0 points Repeat the address phrase from earlier: 0 points 6 CIT Score: 0 points  Advance Directives (For Healthcare) Does Patient Have a Medical Advance Directive?: Yes Does patient want to make changes to medical advance directive?: No - Patient declined Type of Advance Directive: Healthcare Power of [place]; Living will Copy of Healthcare Power of Attorney in Chart?: No - copy requested Copy of Living Will in Chart?: No - copy requested  Reviewed/Updated  Reviewed/Updated: Reviewed All (Medical, Surgical, Family, Medications, Allergies, Care Teams, Patient Goals)    Allergies (verified) Patient has no known allergies.   Current Medications (verified) Outpatient Encounter Medications as of 01/30/2024  Medication Sig   amLODipine  (NORVASC ) 5 MG tablet Take 1 tablet (5 mg total) by mouth daily.   aspirin EC 81 MG tablet Take 81 mg by mouth daily. Swallow whole.   rosuvastatin  (CRESTOR ) 20 MG tablet Take 1 tablet (20 mg total) by mouth daily.   tadalafil  (CIALIS ) 5 MG tablet Take 0.5 tablets (2.5 mg total) by mouth daily.   ezetimibe  (ZETIA ) 10 MG tablet Take 1 tablet (10 mg total) by mouth daily. (Patient not taking: Reported on 01/30/2024)   naproxen  (NAPROSYN ) 500 MG tablet Take 1 tablet (500 mg total) by mouth 2 (two) times daily with a meal. (Patient not  taking: Reported on 01/30/2024)   No facility-administered encounter medications on file as of 01/30/2024.    History: Past Medical History:  Diagnosis Date   Anemia    pt denies    Hypercholesteremia    Hypertension    Ischemic heart disease    Past Surgical History:  Procedure Laterality Date   COLONOSCOPY  05/2021   CORONARY ANGIOPLASTY  01/31/2003   LAPAROSCOPIC INGUINAL HERNIA REPAIR Bilateral    right x 2; left x1   LUMBAR LAMINECTOMY/DECOMPRESSION MICRODISCECTOMY N/A 07/26/2021   Procedure: BILATERAL LUMBAR THREE-FOUR LAMINECTOMY/ FORAMINOTOMY, LEFT LUMBAR FOUR-FIVE DISCECTOMY;  Surgeon: Mavis Purchase, MD;  Location: [facility] OR;  Service: Neurosurgery;  Laterality: N/A;  BILATERAL LUMBAR THREE-FOUR LAMINECTOMY/ FORAMINOTOMY, LEFT LUMBAR FOUR-FIVE DISCECTOMY   TONSILLECTOMY     US  ECHOCARDIOGRAPHY  05/27/2009   Family History  Problem Relation Age of Onset   Stroke Father    Heart attack Father    Cancer Brother        Lung   Heart attack Brother    Cancer Paternal Grandmother        Kidney   Social History   Occupational History   Not on file  Tobacco Use   Smoking status: Former    Types: Cigarettes   Smokeless tobacco: Never  Vaping Use   Vaping status: Never Used  Substance and Sexual Activity   Alcohol use: Yes    Comment: socially   Drug use: Never   Sexual activity: Yes   Tobacco Counseling Counseling given: Not Answered  SDOH Screenings   Food Insecurity: No Food Insecurity (01/30/2024)  Housing: Unknown (01/30/2024)  Transportation Needs: No Transportation Needs (01/30/2024)  Utilities: Not At Risk (01/30/2024)  Alcohol Screen: Low Risk (01/30/2024)  Depression (PHQ2-9): Low Risk (01/30/2024)  Financial Resource Strain: Low Risk (01/30/2024)  Physical Activity: Sufficiently Active (01/30/2024)  Social Connections: Moderately Integrated (01/30/2024)  Stress: No Stress Concern Present (01/30/2024)  Tobacco Use: Medium Risk (01/30/2024)  Health Literacy: Adequate Health Literacy (01/30/2024)   See flowsheets for full screening details  Depression Screen PHQ 2 & 9 Depression Scale- Over the past 2 weeks, how often have you been bothered  by any of the following problems? Little interest or pleasure in doing things: 0 Feeling down, depressed, or hopeless (PHQ Adolescent also includes...irritable): 0 PHQ-2 Total Score: 0 Trouble falling or staying asleep, or sleeping too much: 0 Feeling tired or having little energy: 0 Poor appetite or overeating (PHQ Adolescent also includes...weight loss): 0 Feeling bad about yourself - or that you are a failure or have let yourself or your family down: 0 Trouble concentrating on things, such as reading the newspaper or watching television (PHQ Adolescent also includes...like school work): 0 Moving or speaking so slowly that other people could have noticed. Or the opposite - being so fidgety or restless that you have been moving around a lot more than usual: 0 Thoughts that you would be better off dead, or of hurting yourself in some way: 0 PHQ-9 Total Score: 0 If you checked off any problems, how difficult have these problems made it for you to do your work, take care of things at home, or get along with other people?: Not difficult at all  Depression Treatment Depression Interventions/Treatment : EYV7-0 Score <4 Follow-up Not Indicated     Goals Addressed             This Visit's Progress    Patient Stated       01/30/2024, exercise a little  more             Objective:    Today's Vitals   01/30/24 0822  BP: 122/70  Pulse: 72  Temp: 98.6 F (37 C)  TempSrc: Oral  SpO2: 94%  Weight: 181 lb 6.4 oz (82.3 kg)  Height: 6' 0.5 (1.842 m)   Body mass index is 24.26 kg/m.  Hearing/Vision screen Hearing Screening - Comments:: Denies hearing issues Vision Screening - Comments:: Regular eye exams, [facility] Immunizations and Health Maintenance Health Maintenance  Topic Date Due   Hepatitis C Screening  Never done   DTaP/Tdap/Td (1 - Tdap) Never done   Pneumococcal Vaccine: 50+ Years (1 of 2 - PCV) Never done   Zoster Vaccines- Shingrix (1 of 2) Never done    Influenza Vaccine  Never done   COVID-19 Vaccine (4 - 2025-26 season) 09/18/2023   Medicare Annual Wellness (AWV)  01/29/2025   Colonoscopy  08/02/2025   Meningococcal B Vaccine  Aged Out        Assessment/Plan:  This is a routine wellness examination for Whitney.  Patient Care Team: Thedora Garnette HERO, MD as PCP - General (Family Medicine) Maranda Leim DEL, MD as Referring Physician (Cardiology) Cleotilde Elspeth CROME, OD (Optometry)  I have personally reviewed and noted the following in the patients chart:   Medical and social history Use of alcohol, tobacco or illicit drugs  Current medications and supplements including opioid prescriptions. Functional ability and status Nutritional status Physical activity Advanced directives List of other physicians Hospitalizations, surgeries, and ER visits in previous 12 months Vitals Screenings to include cognitive, depression, and falls Referrals and appointments  No orders of the defined types were placed in this encounter.  In addition, I have reviewed and discussed with patient certain preventive protocols, quality metrics, and best practice recommendations. A written personalized care plan for preventive services as well as general preventive health recommendations were provided to patient.   Ardella FORBES Dawn, LPN   [id number]/7973   Return in 1 year (on 01/29/2025).  After Visit Summary: (In Person-Printed) AVS printed and given to the patient  Nurse Notes: Vaccines not given: flu, shingles, pneumonia, TDAP and covid declined today HM Addressed: Declined making an appointment with Dr. Thedora at this time. Requested a report of last colonoscopy. Patient states he had one since 2017.  "

## 2024-01-30 NOTE — Patient Instructions (Signed)
 Mr. Kyle Roberts,  Thank you for taking the time for your Medicare Wellness Visit. I appreciate your continued commitment to your health goals. Please review the care plan we discussed, and feel free to reach out if I can assist you further.  Please note that Annual Wellness Visits do not include a physical exam. Some assessments may be limited, especially if the visit was conducted virtually. If needed, we may recommend an in-person follow-up with your provider.  Ongoing Care Seeing your primary care provider every 3 to 6 months helps us  monitor your health and provide consistent, personalized care.   Referrals If a referral was made during today's visit and you haven't received any updates within two weeks, please contact the referred provider directly to check on the status.  Recommended Screenings:  Health Maintenance  Topic Date Due   Hepatitis C Screening  Never done   DTaP/Tdap/Td vaccine (1 - Tdap) Never done   Pneumococcal Vaccine for age over 36 (1 of 2 - PCV) Never done   Zoster (Shingles) Vaccine (1 of 2) Never done   Medicare Annual Wellness Visit  05/09/2023   Flu Shot  Never done   COVID-19 Vaccine (4 - 2025-26 season) 09/18/2023   Colon Cancer Screening  08/02/2025   Meningitis B Vaccine  Aged Out       01/30/2024    8:32 AM  Advanced Directives  Does Patient Have a Medical Advance Directive? Yes  Type of Estate Agent of [place];Living will  Does patient want to make changes to medical advance directive? No - Patient declined  Copy of Healthcare Power of Attorney in Chart? No - copy requested    Vision: Annual vision screenings are recommended for early detection of glaucoma, cataracts, and diabetic retinopathy. These exams can also reveal signs of chronic conditions such as diabetes and high blood pressure.  Dental: Annual dental screenings help detect early signs of oral cancer, gum disease, and other conditions linked to overall health,  including heart disease and diabetes.  Please see the attached documents for additional preventive care recommendations.

## 2024-02-02 ENCOUNTER — Ambulatory Visit

## 2025-02-04 ENCOUNTER — Ambulatory Visit
